# Patient Record
Sex: Female | Born: 1975 | Race: White | Hispanic: No | Marital: Married | Smoking: Never smoker
Health system: Southern US, Community
[De-identification: ages and names within clinical notes are randomized; demographics above are authoritative.]

## PROBLEM LIST (undated history)

## (undated) DIAGNOSIS — K900 Celiac disease: Secondary | ICD-10-CM

## (undated) DIAGNOSIS — T783XXA Angioneurotic edema, initial encounter: Secondary | ICD-10-CM

## (undated) HISTORY — DX: Celiac disease: K90.0

## (undated) HISTORY — PX: KNEE SURGERY: SHX244

## (undated) HISTORY — DX: Angioneurotic edema, initial encounter: T78.3XXA

---

## 1999-03-27 ENCOUNTER — Ambulatory Visit (HOSPITAL_BASED_OUTPATIENT_CLINIC_OR_DEPARTMENT_OTHER): Admission: RE | Admit: 1999-03-27 | Discharge: 1999-03-27 | Payer: Self-pay | Admitting: Orthopedic Surgery

## 1999-06-20 ENCOUNTER — Other Ambulatory Visit: Admission: RE | Admit: 1999-06-20 | Discharge: 1999-06-20 | Payer: Self-pay | Admitting: Obstetrics and Gynecology

## 1999-06-20 ENCOUNTER — Encounter (INDEPENDENT_AMBULATORY_CARE_PROVIDER_SITE_OTHER): Payer: Self-pay | Admitting: Specialist

## 1999-10-08 ENCOUNTER — Other Ambulatory Visit: Admission: RE | Admit: 1999-10-08 | Discharge: 1999-10-08 | Payer: Self-pay | Admitting: Obstetrics and Gynecology

## 2000-03-10 ENCOUNTER — Other Ambulatory Visit: Admission: RE | Admit: 2000-03-10 | Discharge: 2000-03-10 | Payer: Self-pay | Admitting: Obstetrics and Gynecology

## 2000-06-17 ENCOUNTER — Other Ambulatory Visit: Admission: RE | Admit: 2000-06-17 | Discharge: 2000-06-17 | Payer: Self-pay | Admitting: Obstetrics and Gynecology

## 2000-12-30 ENCOUNTER — Other Ambulatory Visit: Admission: RE | Admit: 2000-12-30 | Discharge: 2000-12-30 | Payer: Self-pay | Admitting: Obstetrics and Gynecology

## 2001-08-09 ENCOUNTER — Other Ambulatory Visit: Admission: RE | Admit: 2001-08-09 | Discharge: 2001-08-09 | Payer: Self-pay | Admitting: Obstetrics and Gynecology

## 2003-01-31 ENCOUNTER — Other Ambulatory Visit: Admission: RE | Admit: 2003-01-31 | Discharge: 2003-01-31 | Payer: Self-pay | Admitting: Obstetrics and Gynecology

## 2004-01-04 ENCOUNTER — Other Ambulatory Visit: Admission: RE | Admit: 2004-01-04 | Discharge: 2004-01-04 | Payer: Self-pay | Admitting: Obstetrics and Gynecology

## 2004-01-11 ENCOUNTER — Ambulatory Visit (HOSPITAL_COMMUNITY): Admission: RE | Admit: 2004-01-11 | Discharge: 2004-01-11 | Payer: Self-pay | Admitting: Obstetrics and Gynecology

## 2004-09-25 ENCOUNTER — Inpatient Hospital Stay (HOSPITAL_COMMUNITY): Admission: AD | Admit: 2004-09-25 | Discharge: 2004-09-26 | Payer: Self-pay | Admitting: Obstetrics and Gynecology

## 2004-10-08 ENCOUNTER — Encounter: Admission: RE | Admit: 2004-10-08 | Discharge: 2004-10-08 | Payer: Self-pay | Admitting: Obstetrics and Gynecology

## 2005-03-19 ENCOUNTER — Other Ambulatory Visit: Admission: RE | Admit: 2005-03-19 | Discharge: 2005-03-19 | Payer: Self-pay | Admitting: Obstetrics and Gynecology

## 2006-03-24 ENCOUNTER — Other Ambulatory Visit: Admission: RE | Admit: 2006-03-24 | Discharge: 2006-03-24 | Payer: Self-pay | Admitting: Obstetrics and Gynecology

## 2009-04-17 ENCOUNTER — Encounter: Admission: RE | Admit: 2009-04-17 | Discharge: 2009-04-17 | Payer: Self-pay | Admitting: Obstetrics and Gynecology

## 2010-04-16 ENCOUNTER — Ambulatory Visit (HOSPITAL_COMMUNITY): Admission: RE | Admit: 2010-04-16 | Discharge: 2010-04-16 | Payer: Self-pay | Admitting: Obstetrics and Gynecology

## 2010-07-04 ENCOUNTER — Inpatient Hospital Stay (HOSPITAL_COMMUNITY): Admission: AD | Admit: 2010-07-04 | Discharge: 2010-07-06 | Payer: Self-pay | Admitting: Obstetrics and Gynecology

## 2010-07-08 ENCOUNTER — Ambulatory Visit: Admission: RE | Admit: 2010-07-08 | Discharge: 2010-07-08 | Payer: Self-pay | Admitting: Obstetrics and Gynecology

## 2010-07-22 ENCOUNTER — Ambulatory Visit: Admission: RE | Admit: 2010-07-22 | Discharge: 2010-07-22 | Payer: Self-pay | Admitting: Obstetrics and Gynecology

## 2010-11-23 ENCOUNTER — Encounter: Payer: Self-pay | Admitting: Obstetrics and Gynecology

## 2010-11-24 ENCOUNTER — Encounter: Payer: Self-pay | Admitting: Obstetrics and Gynecology

## 2011-01-15 LAB — CBC
HCT: 35.6 % — ABNORMAL LOW (ref 36.0–46.0)
Hemoglobin: 12.4 g/dL (ref 12.0–15.0)
MCH: 34.8 pg — ABNORMAL HIGH (ref 26.0–34.0)
MCHC: 34.8 g/dL (ref 30.0–36.0)
MCV: 100.2 fL — ABNORMAL HIGH (ref 78.0–100.0)

## 2011-01-15 LAB — RPR: RPR Ser Ql: NONREACTIVE

## 2011-03-20 NOTE — Discharge Summary (Signed)
Erika Bell NO.:  [id number]   MEDICAL RECORD NO.:  [id number]          PATIENT TYPE:  INP   LOCATION:  9141                          FACILITY:  [facility]   PHYSICIAN:  Huel Cote, M.D. DATE OF BIRTH:  [birth year]   DATE OF ADMISSION:  09/25/2004  DATE OF DISCHARGE:  09/26/2004                                 DISCHARGE SUMMARY   DISCHARGE MEDICATIONS:  Motrin 600 mg p.o. q.6h.   DISCHARGE FOLLOW-UP:  The patient is to follow up in the office in 6 weeks  for her routine postpartum exam.   HOSPITAL COURSE:  The patient is a 35 year old G2 P0-0-1-0 who is admitted  at 86 and four-sevenths weeks with a complaint of regular contractions and  rupture of membranes at 3:30 a.m.  She was admitted and progressed well,  with her prenatal care complicated by a second trimester ultrasound with  absent nasal bones.  The patient elected amniocentesis with 46,XY  chromosomes noted.  Otherwise, her care was uneventful.  Prenatal labs are  as follows:  A positive, antibody negative, RPR nonreactive, rubella immune,  hepatitis B surface antigen negative, HIV declined, GC negative, chlamydia  negative, triple screen normal, group B strep negative.  Past OB history:  She had one spontaneous miscarriage in 2005.  Past GYN history:  History of  abnormal Pap smears which resolved without treatment.  Past surgical  history:  None.  Past medical history:  None.  Allergies:  SULFA.  Medications:  None.  She was afebrile with stable vital signs.  On admission  her fetal heart rate was reactive.  The patient progressed to complete  dilation and OP position, receiving no pain medication.  She pushed for  approximately 30 minutes with a normal spontaneous vaginal delivery of a  vigorous female infant in the ROP presentation.  Apgars were 9 and 9, weight  was 7 pounds 9 ounces.  She had a small first degree laceration which was  repaired with 2-0 Vicryl for hemostasis. Cervix and rectum  were intact.  She  was then admitted for routine care and on postpartum day #2 felt very well  and desired discharge home early.  Therefore, she was discharged to home to  follow up in the office in 6 weeks.     Kath   KR/MEDQ  D:  10/27/2004  T:  10/27/2004  Job:  [id number]

## 2013-06-07 ENCOUNTER — Other Ambulatory Visit: Payer: Self-pay | Admitting: Obstetrics and Gynecology

## 2013-06-07 DIAGNOSIS — N644 Mastodynia: Secondary | ICD-10-CM

## 2013-06-07 DIAGNOSIS — R922 Inconclusive mammogram: Secondary | ICD-10-CM

## 2013-06-21 ENCOUNTER — Ambulatory Visit
Admission: RE | Admit: 2013-06-21 | Discharge: 2013-06-21 | Disposition: A | Discharge status: Home / Self Care | Payer: BC Managed Care – PPO | Source: Ambulatory Visit | Attending: Obstetrics and Gynecology | Admitting: Obstetrics and Gynecology

## 2013-06-21 DIAGNOSIS — R9230 Dense breasts, unspecified: Secondary | ICD-10-CM

## 2013-06-21 DIAGNOSIS — N644 Mastodynia: Secondary | ICD-10-CM

## 2013-06-21 DIAGNOSIS — R922 Inconclusive mammogram: Secondary | ICD-10-CM

## 2014-01-12 ENCOUNTER — Encounter: Payer: Self-pay | Admitting: Dietician

## 2014-01-12 ENCOUNTER — Encounter: Payer: BC Managed Care – PPO | Attending: Gastroenterology | Admitting: Dietician

## 2014-01-12 VITALS — Ht 69.0 in | Wt 144.8 lb

## 2014-01-12 DIAGNOSIS — K900 Celiac disease: Secondary | ICD-10-CM

## 2014-01-12 DIAGNOSIS — Z713 Dietary counseling and surveillance: Secondary | ICD-10-CM | POA: Insufficient documentation

## 2014-01-12 NOTE — Patient Instructions (Signed)
Consider adding gluten free multivitamin, B12, and Vitamin D. Try some protein powder to add to smoothies, oatmeal, or drinks. Aim to get a source of protein at each (soy, nuts or nut butter, or beans). Consider getting a new toaster to help avoid cross contamination at home.  Look at this website [employer]ollie [employer]) for guidance: https://glutenfreediet.ca/about_gf.php

## 2014-01-12 NOTE — Progress Notes (Addendum)
  Medical Nutrition Therapy:  Appt start time: 0945 end time:  1045.  Assessment:  Primary concerns today: Nessa is here today since she has celiac disease and was diagnosed in January. States that she had acute symptoms for a year including having problems with dairy, though has had GI issues since early adulthood. Has been following a gluten free diet since January feels fairly confident with that. Also states that she is a vegetarian.    Preferred Learning Style:   No preference indicated   Learning Readiness:   Ready  MEDICATIONS: none   DIETARY INTAKE:  Avoided foods include gluten and meat and garlic    16-XW recall:  B ( AM): gluten free toast with peanut and a latte or protein bar or cereal Snk ( AM): nuts with dried or fresh fruit or cheese   L ( PM): salad or beans and vegetables (cafeteria at [facility]) Snk ( PM): nuts with dried or fresh fruit or cheese or 1/2 protein bar D ( PM): gluten free pasta or pizza with vegetables or vegetarian chili  Snk ( PM): none Beverages: water and unsweet tea with splenda   Usual physical activity: run 3 x week and cross train 3 x week   Estimated energy needs: 1800 calories 200 g carbohydrates 135 g protein 50 g fat  Progress Towards Goal(s):  In progress.   Nutritional Diagnosis:  [place]-1.4 Altered GI function As related to hx of consuming gluten and GI distress.  As evidenced by diagnosis of celiac disease.    Intervention:  Nutrition counseling provided. Plan: Consider adding gluten free multivitamin, B12, and Vitamin D. Try some protein powder to add to smoothies, oatmeal, or drinks. Aim to get a source of protein at each (soy, nuts or nut butter, or beans). Consider getting a new toaster to help avoid cross contamination at home.  Look at this website [employer]oll[employer]) for guidance: https://glutenfreediet.ca/about_gf.php   Teaching Method Utilized:  Visual Auditory Hands on  Handouts given during visit  include:  Protein Supplement List  Vegetarian Proteins  Gluten-free Vegetarian Article  Supplements given during visit include:  2 Unflavored Unjury Protein, lot # [id number], exp 4/16  2 Vanilla Unjury Protein, lot # [id number], exp 1/16  Barriers to learning/adherence to lifestyle change: none  Demonstrated degree of understanding via:  Teach Back   Monitoring/Evaluation:  Dietary intake, exercise, and body weight prn.

## 2016-01-23 ENCOUNTER — Encounter: Payer: Self-pay | Admitting: Family Medicine

## 2016-01-23 ENCOUNTER — Other Ambulatory Visit (INDEPENDENT_AMBULATORY_CARE_PROVIDER_SITE_OTHER): Payer: BLUE CROSS/BLUE SHIELD

## 2016-01-23 ENCOUNTER — Ambulatory Visit (INDEPENDENT_AMBULATORY_CARE_PROVIDER_SITE_OTHER): Payer: BLUE CROSS/BLUE SHIELD | Admitting: Family Medicine

## 2016-01-23 VITALS — BP 116/68 | HR 61 | Ht 69.0 in | Wt 151.0 lb

## 2016-01-23 DIAGNOSIS — M25562 Pain in left knee: Secondary | ICD-10-CM | POA: Diagnosis not present

## 2016-01-23 DIAGNOSIS — S83207A Unspecified tear of unspecified meniscus, current injury, left knee, initial encounter: Secondary | ICD-10-CM | POA: Diagnosis not present

## 2016-01-23 DIAGNOSIS — S86111A Strain of other muscle(s) and tendon(s) of posterior muscle group at lower leg level, right leg, initial encounter: Secondary | ICD-10-CM | POA: Diagnosis not present

## 2016-01-23 MED ORDER — VITAMIN D (ERGOCALCIFEROL) 1.25 MG (50000 UNIT) PO CAPS: 50000.0000 [IU] | ORAL_CAPSULE | ORAL | Status: DC

## 2016-01-23 MED ORDER — DICLOFENAC SODIUM 2 % TD SOLN: 2.0000 "application " | Freq: Two times a day (BID) | TRANSDERMAL | Status: DC

## 2016-01-23 NOTE — Patient Instructions (Signed)
Good to see you.  Ice 20 minutes 2 times daily. Usually after activity and before bed. Exercises 3 times a week.  Avoid deep squats, running jumping or twisting for 4 weeks.  pennsaid pinkie amount topically 2 times daily as needed.  Turmeric 500mg  twice daily  Bromelain 2500mg  with each meal can help with protein absorption.  Once weekly vitamin D for next 12 weeks (at your pharmacy) For the foot wear a heel lift at all times and still avoid being barefoot in the house. Likely because your are compensating for the knee.  Biking elliptical and swimming would be great and continue the weight training but just be careful See me again in 4 weeks.

## 2016-01-23 NOTE — Progress Notes (Signed)
Pre visit review using our clinic review tool, if applicable. No additional management support is needed unless otherwise documented below in the visit note. 

## 2016-01-23 NOTE — Assessment & Plan Note (Signed)
Anterior medial meniscal tear noted. Patient has had removal of the posterior medial and lateral meniscus. Patient has celiac disease and likely is contribute in. We discussed icing regimen, vitamin D supplementation, topical anti-inflammatories, we discussed over-the-counter medications. Home exercises given and patient work with Product/process development scientist. We discussed which activities to avoid. Patient will come back and see me again in 4 weeks. At that time if not doing significant better we'll consider formal physical therapy and injection. If she continues to have pain x-rays will be necessary as well.

## 2016-01-23 NOTE — Assessment & Plan Note (Signed)
We'll further evaluate at follow-up

## 2016-01-23 NOTE — Progress Notes (Signed)
[facility] [place], [place] [address] Phone: [phone] Subjective:       CC: left knee pain  [id number] Erika Bell is a 40 y.o. female coming in with complaint of left knee pain. Patient states that she's had this pain for approximately 1 year. Patient had been training for triathlons. Started having increasing pain. History of meniscectomy in this knee 20 years ago. Patient states that unfortunately she continues to have a dull aching pain that is even affecting her at work. Patient states that sometimes it can be a throbbing pain. Sometimes feels full. 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 Continues to lift on a regular basis. Denies any radiation of pain. Sometimes feels like she is having a locking type mechanism. Patient rates the severity of pain a 6 out of 10. Wondering to avoid any other type of surgical intervention if possible.     Past Medical History  Diagnosis Date  . Celiac disease    Past Surgical History  Procedure Laterality Date  . Knee surgery     Social History   Social History  . Marital Status: Married    Spouse Name: N/A  . Number of Children: N/A  . Years of Education: N/A   Social History Main Topics  . Smoking status: Never Smoker   . Smokeless tobacco: None  . Alcohol Use: None  . Drug Use: None  . Sexual Activity: Not Asked   Other Topics Concern  . None   Social History Narrative   Allergies  Allergen Reactions  . Garlic   . Gluten Meal   . Sulfa Antibiotics    Family History  Problem Relation Age of Onset  . Hypertension Other   . Hyperlipidemia Other   . Obesity Other     Past medical history, social, surgical and family history all reviewed in electronic medical record.  No pertanent information unless stated regarding to the chief complaint.   Review of Systems: No headache, visual changes, nausea, vomiting, diarrhea, constipation, dizziness,  abdominal pain, skin rash, fevers, chills, night sweats, weight loss, swollen lymph nodes, body aches, joint swelling, muscle aches, chest pain, shortness of breath, mood changes.   Objective Blood pressure 116/68, pulse 61, height 5\' 9"  (1.753 m), weight 151 lb (68.493 kg), SpO2 98 %.  General: No apparent distress alert and oriented x3 mood and affect normal, dressed appropriately.  HEENT: Pupils equal, extraocular movements intact  Respiratory: Patient's speak in full sentences and does not appear short of breath  Cardiovascular: No lower extremity edema, non tender, no erythema  Skin: Warm dry intact with no signs of infection or rash on extremities or on axial skeleton.  Abdomen: Soft nontender  Neuro: Cranial nerves II through XII are intact, neurovascularly intact in all extremities with 2+ DTRs and 2+ pulses.  Lymph: No lymphadenopathy of posterior or anterior cervical chain or axillae bilaterally.  Gait normal with good balance and coordination.  MSK:  Non tender with full range of motion and good stability and symmetric strength and tone of shoulders, elbows, wrist, hip, and ankles bilaterally. Patient does have some discomfort of the right posterior tibialis tendon. Knee:left Trace effusion noted Tender to palpation over the medial joint line ROM full in flexion and extension and lower leg rotation. Ligaments with solid consistent endpoints including ACL, PCL, LCL, MCL. positiveMcmurray's, Apley's, and Thessalonian tests. Non painful patellar compression. Patellar glide with mildcrepitus. Patellar and  quadriceps tendons unremarkable. Hamstring and quadriceps strength is normal.  Contralateral knee unremarkable  MSK US performed of: left knee This study was ordered, performed, and interpreted by Charlann Boxer D.O.  Knee: All structures visualized. Patient does have what appears to be a removal of the posterior medial meniscus. Patient does have an acute tear of the anterior  medial meniscus. Increasing Doppler flow and mild displacement noted. Increasing hypoechoic changes in the area as well. Patient's lateral joint line does have mild to moderate arthritic changes. Seems to have removal of the posterior lateral meniscus as well.t. Patellar Tendon unremarkable on long and transverse views without effusion. No abnormality of prepatellar bursa. LCL and MCL unremarkable on long and transverse views. No abnormality of origin of medial or lateral head of the gastrocnemius.  IMPRESSION:  Post surgical changes with acute medial meniscal tear    procedure note 97110; 15 minutes spent for Therapeutic exercises as stated in above notes.  This included exercises focusing on stretching, strengthening, with significant focus on eccentric aspects. Flexion and extension exercises focusing on the hip abductors as well as the vastus medialis oblique muscles for stability. Eccentric exercises avoiding twisting motions.  Proper technique shown and discussed handout in great detail with ATC.  All questions were discussed and answered.   Impression and Recommendations:     This case required medical decision making of moderate complexity.      Note: This dictation was prepared with Dragon dictation along with smaller phrase technology. Any transcriptional errors that result from this process are unintentional.

## 2016-01-24 ENCOUNTER — Encounter: Payer: Self-pay | Admitting: Family Medicine

## 2016-01-24 ENCOUNTER — Telehealth: Payer: Self-pay | Admitting: Family Medicine

## 2016-01-24 NOTE — Telephone Encounter (Signed)
Spoke to Erika Bell, she had some questions for dr Tamala Julian. Since dr Tamala Julian is out of the office today, she is going to set up mychart so she can email him her questions.

## 2016-01-24 NOTE — Telephone Encounter (Signed)
Has questions in regards to vitamins that Dr. Tamala Julian told her to take yesterday. States can not find the dosage he told her to take.

## 2016-02-20 ENCOUNTER — Ambulatory Visit (INDEPENDENT_AMBULATORY_CARE_PROVIDER_SITE_OTHER)
Admission: RE | Admit: 2016-02-20 | Discharge: 2016-02-20 | Disposition: A | Discharge status: Home / Self Care | Payer: BLUE CROSS/BLUE SHIELD | Source: Ambulatory Visit | Attending: Family Medicine | Admitting: Family Medicine

## 2016-02-20 ENCOUNTER — Encounter: Payer: Self-pay | Admitting: Family Medicine

## 2016-02-20 ENCOUNTER — Ambulatory Visit (INDEPENDENT_AMBULATORY_CARE_PROVIDER_SITE_OTHER): Payer: BLUE CROSS/BLUE SHIELD | Admitting: Family Medicine

## 2016-02-20 VITALS — BP 112/72 | HR 60 | Ht 69.0 in | Wt 149.0 lb

## 2016-02-20 DIAGNOSIS — S83207D Unspecified tear of unspecified meniscus, current injury, left knee, subsequent encounter: Secondary | ICD-10-CM | POA: Diagnosis not present

## 2016-02-20 DIAGNOSIS — S83207A Unspecified tear of unspecified meniscus, current injury, left knee, initial encounter: Secondary | ICD-10-CM

## 2016-02-20 DIAGNOSIS — M25562 Pain in left knee: Secondary | ICD-10-CM | POA: Diagnosis not present

## 2016-02-20 NOTE — Progress Notes (Signed)
Pre visit review using our clinic review tool, if applicable. No additional management support is needed unless otherwise documented below in the visit note. 

## 2016-02-20 NOTE — Progress Notes (Signed)
[facility] [place], [place] [address] Phone: [phone] Subjective:       CC: left knee pain follow-up [id number] Erika Bell is a 40 y.o. female coming in with complaint of left knee pain. Patient was found to have an acute meniscal tear of the left knee. Patient states that unfortunately she is having worsening symptoms. States that the conservative therapy did not seem to help. Starting to even walk on her. Has difficulty even straightening the knee held sometimes. Continues to bike ride fairly regularly. Denies any new injury..     Past Medical History  Diagnosis Date  . Celiac disease    Past Surgical History  Procedure Laterality Date  . Knee surgery     Social History   Social History  . Marital Status: Married    Spouse Name: N/A  . Number of Children: N/A  . Years of Education: N/A   Social History Main Topics  . Smoking status: Never Smoker   . Smokeless tobacco: Not on file  . Alcohol Use: Not on file  . Drug Use: Not on file  . Sexual Activity: Not on file   Other Topics Concern  . Not on file   Social History Narrative   Allergies  Allergen Reactions  . Garlic   . Gluten Meal   . Sulfa Antibiotics    Family History  Problem Relation Age of Onset  . Hypertension Other   . Hyperlipidemia Other   . Obesity Other     Past medical history, social, surgical and family history all reviewed in electronic medical record.  No pertanent information unless stated regarding to the chief complaint.   Review of Systems: No headache, visual changes, nausea, vomiting, diarrhea, constipation, dizziness, abdominal pain, skin rash, fevers, chills, night sweats, weight loss, swollen lymph nodes, body aches, joint swelling, muscle aches, chest pain, shortness of breath, mood changes.   Objective There were no vitals taken for this visit.  General: No apparent distress alert and oriented x3 mood and affect  normal, dressed appropriately.  HEENT: Pupils equal, extraocular movements intact  Respiratory: Patient's speak in full sentences and does not appear short of breath  Cardiovascular: No lower extremity edema, non tender, no erythema  Skin: Warm dry intact with no signs of infection or rash on extremities or on axial skeleton.  Abdomen: Soft nontender  Neuro: Cranial nerves II through XII are intact, neurovascularly intact in all extremities with 2+ DTRs and 2+ pulses.  Lymph: No lymphadenopathy of posterior or anterior cervical chain or axillae bilaterally.  Gait normal with good balance and coordination.  MSK:  Non tender with full range of motion and good stability and symmetric strength and tone of shoulders, elbows, wrist, hip, and ankles bilaterally. Patient does have some discomfort of the right posterior tibialis tendon. Knee:left Trace effusion noted about stable from previous exam Tender to palpation over the medial joint line Akin the last 5 of extension Ligaments with solid consistent endpoints including ACL, PCL, LCL, MCL. positiveMcmurray's, Apley's, and Thessalonian tests.worse than previous exam Non painful patellar compression. Patellar glide with mildcrepitus. Patellar and quadriceps tendons unremarkable. Hamstring and quadriceps strength is normal.  Contralateral knee unremarkable  After informed written and verbal consent, patient was seated on exam table. Left knee was prepped with alcohol swab and utilizing anterolateral approach, patient's left knee space was injected with 4:1  marcaine 0.5%: Kenalog 40mg /dL. Patient tolerated the procedure well without immediate complications.  Impression and Recommendations:     This case required medical decision making of moderate complexity.      Note: This dictation was prepared with Dragon dictation along with smaller phrase technology. Any transcriptional errors that result from this process are unintentional.

## 2016-02-20 NOTE — Assessment & Plan Note (Signed)
Patient given injection today and tolerated the procedure well. We discussed icing regimen and home exercises. We discussed which activities to do in which ones to avoid. We discussed formal physical therapy and patient has been referred. Patient has any locking or finds that the range of motion seems to be worsening then she needs to be a candidate for advanced imaging. Patient's goal is to be able to do triathlons this year  Spent  25 minutes with patient face-to-face and had greater than 50% of counseling including as described above in assessment and plan.

## 2016-02-20 NOTE — Patient Instructions (Signed)
Good to see you  Ice is still your friend Erika Bell [facility] Pt for the knee will be great  Stay active but no flexing the knee and twisting.   I would rather have you bike on flat surfaces and elliptical or swimming for at least 2 weeks and then we will see what [name] says otherwise. Xrays downstairs as well.  Continue the vitamins See me again in 4 weeks.

## 2016-03-17 ENCOUNTER — Ambulatory Visit (INDEPENDENT_AMBULATORY_CARE_PROVIDER_SITE_OTHER): Payer: BLUE CROSS/BLUE SHIELD | Admitting: Allergy and Immunology

## 2016-03-17 ENCOUNTER — Encounter: Payer: Self-pay | Admitting: Allergy and Immunology

## 2016-03-17 VITALS — BP 104/62 | HR 64 | Temp 98.3°F | Resp 16 | Ht 67.32 in | Wt 150.0 lb

## 2016-03-17 DIAGNOSIS — J309 Allergic rhinitis, unspecified: Secondary | ICD-10-CM | POA: Diagnosis not present

## 2016-03-17 DIAGNOSIS — R197 Diarrhea, unspecified: Secondary | ICD-10-CM | POA: Diagnosis not present

## 2016-03-17 DIAGNOSIS — K900 Celiac disease: Secondary | ICD-10-CM

## 2016-03-17 DIAGNOSIS — E559 Vitamin D deficiency, unspecified: Secondary | ICD-10-CM

## 2016-03-17 DIAGNOSIS — H1010 Acute atopic conjunctivitis, unspecified eye: Secondary | ICD-10-CM

## 2016-03-17 NOTE — Patient Instructions (Addendum)
  1. Follow-up with Dr. Paulita Fujita concerning further evaluation and treatment of diarrhea  2. Obtain a bone densitometry scan with primary care physician  3. Blood - IgA/G/M  4. Continue Nasonex, montelukast, and antihistamine

## 2016-03-17 NOTE — Progress Notes (Signed)
Dear Dr. Paulita Fujita,  Thank you for referring Erika Bell to the [facility] on 03/17/2016.   Below is a summation of this patient's evaluation and recommendations.  Thank you for your referral. I will keep you informed about this patient's response to treatment.   If you have any questions please to do hestitate to contact me.   Sincerely,  Erika Prows, MD [place]   ______________________________________________________________________    NEW PATIENT NOTE  Referring Provider: Arta Silence, MD Primary Provider: Landry Dyke, MD Date of office visit: 03/17/2016    Subjective:   Chief Complaint:  Erika Bell (DOB: [birth year]) is a 40 y.o. female with a chief complaint of Diarrhea and Allergies  who presents to the clinic on 03/17/2016 with the following problems:  HPI: Erika Bell presents to this clinic in evaluation of diarrhea. She has a history of gluten hypersensitivity diagnosed by Dr. Paulita Fujita with a positive tissue transglutaminase antibody assay and upper endoscopy with duodenal biopsy showing villous architectural changes approximately 2-1/2 years ago. She's been gluten free ever since that point in time. Initially all of her gastrointestinal symptoms resolved and her dorsal hand dermatitis also apparently resolved.   Unfortunately, she has been developing mucousy diarrhea several times per day since around September or so of this past year. She has worked through this issue by eliminating dairy which does appear to help somewhat but she still continues to have significant problems with mucousy diarrhea several times per day without consumption of dairy. On 2 occasions she did have an accident eating dairy since September with consumption of a pizza and consumption of feta cheese and within one hour she developed very severe diarrhea and actually ended up developing  left eye periorbital swelling over the subsequent 12-16 hours with both of those exposures. She had no other associated systemic or constitutional symptoms.  At this point she is gluten-free, dairy free, and basically she is a vegetarian. She has performed informal food elimination diets with various foods that have not affected her pattern of mucousy diarrhea other than dairy elimination which has helped somewhat. She has eliminated various medications for a period in time which did not affect her pattern of mucousy diarrhea.  Erika Bell apparently has a vitamin D deficiency and is now on supplementation including weekly 50,000 international unit boluses of vitamin D. She has not had a bone densitometry scan.  Erika Bell has a history of allergic rhinoconjunctivitis treated with immunotherapy over the course of the past 3 years which she discontinued this past year. She had a good response to this therapy yet still continues to require the use of a nasal steroid and a leukotriene modifier and antihistamine with good results.  Past Medical History  Diagnosis Date  . Celiac disease     Past Surgical History  Procedure Laterality Date  . Knee surgery        Medication List           BROMELAIN PO  Take 500 mg by mouth daily.     loratadine 10 MG tablet  Commonly known as:  CLARITIN  Take 10 mg by mouth daily.     mometasone 50 MCG/ACT nasal spray  Commonly known as:  NASONEX  Place 1 spray into the nose daily.     montelukast 10 MG tablet  Commonly known as:  SINGULAIR  Take 10 mg by mouth at bedtime.  PRENATAL VITAMINS PO  Take 1 tablet by mouth daily.     PROBIOTIC DAILY PO  Take 1 tablet by mouth daily.     Vitamin D (Ergocalciferol) 50000 units Caps capsule  Commonly known as:  DRISDOL  Take 1 capsule (50,000 Units total) by mouth every 7 (seven) days.        Allergies  Allergen Reactions  . Gluten Meal   . Sulfa Antibiotics     Review of systems negative  except as noted in HPI / PMHx or noted below:  Review of Systems  Constitutional: Negative.   HENT: Negative.   Eyes: Negative.   Respiratory: Negative.   Cardiovascular: Negative.   Gastrointestinal: Negative.   Genitourinary: Negative.   Musculoskeletal: Negative.   Skin: Negative.   Neurological: Negative.   Endo/Heme/Allergies: Negative.   Psychiatric/Behavioral: Negative.     Family History  Problem Relation Age of Onset  . Hypertension Other   . Hyperlipidemia Other   . Obesity Other     Social History   Social History  . Marital Status: Married    Spouse Name: N/A  . Number of Children: N/A  . Years of Education: N/A   Occupational History  . Not on file.   Social History Main Topics  . Smoking status: Never Smoker   . Smokeless tobacco: Not on file  . Alcohol Use: Not on file  . Drug Use: Not on file  . Sexual Activity: Not on file   Other Topics Concern  . Not on file   Social History Narrative    Environmental and Social history  Lives in a house with a dry environment, no animals located inside the household, carpeting in the bedroom, no plastic on the bed but plastic on the pillow, and no smokers located inside the household. She is a Economist.   Objective:   Filed Vitals:   03/17/16 0830  BP: 104/62  Pulse: 64  Temp: 98.3 F (36.8 C)  Resp: 16   Height: 5' 7.32" (171 cm) Weight: 150 lb (68.04 kg)  Physical Exam  Constitutional: She is well-developed, well-nourished, and in no distress.  HENT:  Head: Normocephalic. Head is without right periorbital erythema and without left periorbital erythema.  Right Ear: Tympanic membrane, external ear and ear canal normal.  Left Ear: Tympanic membrane, external ear and ear canal normal.  Nose: Nose normal. No mucosal edema or rhinorrhea.  Mouth/Throat: Uvula is midline, oropharynx is clear and moist and mucous membranes are normal. No oropharyngeal exudate.  Eyes: Conjunctivae  and lids are normal. Pupils are equal, round, and reactive to light.  Neck: Trachea normal. No tracheal tenderness present. No tracheal deviation present. No thyromegaly present.  Cardiovascular: Normal rate, regular rhythm, S1 normal, S2 normal and normal heart sounds.   No murmur heard. Pulmonary/Chest: Effort normal and breath sounds normal. No stridor. No tachypnea. No respiratory distress. She has no wheezes. She has no rales. She exhibits no tenderness.  Abdominal: Soft. She exhibits no distension and no mass. There is no hepatosplenomegaly. There is no tenderness. There is no rebound and no guarding.  Musculoskeletal: She exhibits no edema or tenderness.  Lymphadenopathy:       Head (right side): No tonsillar adenopathy present.       Head (left side): No tonsillar adenopathy present.    She has no cervical adenopathy.    She has no axillary adenopathy.  Neurological: She is alert. Gait normal.  Skin: No rash noted. She is  not diaphoretic. No erythema. No pallor. Nails show no clubbing.  Psychiatric: Mood and affect normal.     Diagnostics: Allergy skin tests were performed. She did not demonstrate any significant hypersensitivity against a screening panel of foods  Assessment and Plan:    1. Diarrhea, unspecified type   2. Celiac disease   3. Vitamin D deficiency   4. Allergic rhinoconjunctivitis     1. Follow-up with Dr. Paulita Fujita concerning further evaluation and treatment of diarrhea  2. Obtain a bone densitometry scan with primary care physician  3. Blood - IgA/G/M  4. Continue Nasonex, montelukast, and antihistamine  I cannot really find a tremendous amount of evidence for significant food allergy contributing to Shantara's mucousy diarrhea. I've asked her to follow-up with Dr. Paulita Fujita concerning further evaluation and treatment of this apparent inflammatory process within her gut. I did ask her to check a serum immunoglobulin level to see if she is developing  significant protein loss through her gut. As well, given her very prolonged history of gluten hypersensitivity that probably dated back to childhood and her vitamin D deficiency I think it would be best to obtain a bone densitometry scan to screen for possible osteoporosis. She can certainly continue to use medical therapy for her upper airway inflammatory condition as noted above. We'll see her back in this clinic as needed.  Erika Prows, MD [facility]

## 2016-03-19 ENCOUNTER — Encounter: Payer: Self-pay | Admitting: Family Medicine

## 2016-03-19 ENCOUNTER — Ambulatory Visit (INDEPENDENT_AMBULATORY_CARE_PROVIDER_SITE_OTHER): Payer: BLUE CROSS/BLUE SHIELD | Admitting: Family Medicine

## 2016-03-19 VITALS — BP 114/60 | HR 69 | Ht 67.32 in | Wt 150.0 lb

## 2016-03-19 DIAGNOSIS — S83207D Unspecified tear of unspecified meniscus, current injury, left knee, subsequent encounter: Secondary | ICD-10-CM

## 2016-03-19 NOTE — Assessment & Plan Note (Signed)
Doing much better at this time. Has made some progress. Patient will discontinue formal physical therapy when she finishes this week. Patient given a running protocol. We discussed which activities do in which ones to avoid. Slightly will start increasing activity and follow-up again in 4-6 weeks.  Spent  25 minutes with patient face-to-face and had greater than 50% of counseling including as described above in assessment and plan.

## 2016-03-19 NOTE — Progress Notes (Signed)
Pre visit review using our clinic review tool, if applicable. No additional management support is needed unless otherwise documented below in the visit note. 

## 2016-03-19 NOTE — Patient Instructions (Signed)
Great to see you  Yoga 1 time a week for 2 weeks and then can go up ot 2 times a week thereafter.  Continue the vitamins For the ankle try the pennsaid  [employer]co orthotics "total support" online would be great  Start a walk-run progression: - Initially start one minute walking than one minute running for 20 mins in the first week,   then 25 mins during the second week, then 30 mins afterwards.  Once you have reached 30 mins: - Run 2 mins, then walk 1 min. -Then run 3 mins, and walk 1 min. -Then run 4 mins, and walk 1 min. -Then run 5 mins, and walk 1 min. -Slowly build up weekly to running 30 mins nonstop.  If painful at any of the steps, back up one step. See me again in 4-6 weeks to make sure you are still on the good path!

## 2016-03-19 NOTE — Progress Notes (Signed)
[name] [place], [place] [address] Phone: [phone] Subjective:       CC: left knee pain follow-up [id number] Erika Bell is a 40 y.o. female coming in with complaint of left knee pain. Patient was found to have an acute meniscal tear of the left knee. Patient at last exam was having worsening symptoms and elected to try an injection. Patient was given an injection. X-rays were independently visualized by me and showed mild osteophytic changes. Patient was to continue conservative therapy. Patient statesshe is doing much better. Approximately 90% better. Has not increase her activity she is scared that this will start giving her more pain again. Patient has tried to start slowly decreasing some of the activities on a regular basis.     Past Medical History  Diagnosis Date  . Celiac disease    Past Surgical History  Procedure Laterality Date  . Knee surgery     Social History   Social History  . Marital Status: Married    Spouse Name: N/A  . Number of Children: N/A  . Years of Education: N/A   Social History Main Topics  . Smoking status: Never Smoker   . Smokeless tobacco: None  . Alcohol Use: None  . Drug Use: None  . Sexual Activity: Not Asked   Other Topics Concern  . None   Social History Narrative   Allergies  Allergen Reactions  . Gluten Meal   . Sulfa Antibiotics    Family History  Problem Relation Age of Onset  . Hypertension Other   . Hyperlipidemia Other   . Obesity Other     Past medical history, social, surgical and family history all reviewed in electronic medical record.  No pertanent information unless stated regarding to the chief complaint.   Review of Systems: No headache, visual changes, nausea, vomiting, diarrhea, constipation, dizziness, abdominal pain, skin rash, fevers, chills, night sweats, weight loss, swollen lymph nodes, body aches, joint swelling, muscle aches, chest pain,  shortness of breath, mood changes.   Objective Blood pressure 114/60, pulse 69, height 5' 7.32" (1.71 m), weight 150 lb (68.04 kg), last menstrual period 02/19/2016, SpO2 96 %.  General: No apparent distress alert and oriented x3 mood and affect normal, dressed appropriately.  HEENT: Pupils equal, extraocular movements intact  Respiratory: Patient's speak in full sentences and does not appear short of breath  Cardiovascular: No lower extremity edema, non tender, no erythema  Skin: Warm dry intact with no signs of infection or rash on extremities or on axial skeleton.  Abdomen: Soft nontender  Neuro: Cranial nerves II through XII are intact, neurovascularly intact in all extremities with 2+ DTRs and 2+ pulses.  Lymph: No lymphadenopathy of posterior or anterior cervical chain or axillae bilaterally.  Gait normal with good balance and coordination.  MSK:  Non tender with full range of motion and good stability and symmetric strength and tone of shoulders, elbows, wrist, hip, and ankles bilaterally. Patient does have some discomfort of the right posterior tibialis tendon. Knee:left No effusion noted nontender Full range of motion Ligaments with solid consistent endpoints including ACL, PCL, LCL, MCL. Negative McMurray's Non painful patellar compression. Patellar glide with mildcrepitus. Patellar and quadriceps tendons unremarkable. Hamstring and quadriceps strength is normal.  Contralateral knee unremarkable        Impression and Recommendations:     This case required medical decision making of moderate complexity.      Note: This dictation was  prepared with Dragon dictation along with smaller phrase technology. Any transcriptional errors that result from this process are unintentional.

## 2016-04-01 ENCOUNTER — Other Ambulatory Visit: Payer: Self-pay | Admitting: Orthopedic Surgery

## 2016-04-01 DIAGNOSIS — M25562 Pain in left knee: Secondary | ICD-10-CM

## 2016-04-12 ENCOUNTER — Other Ambulatory Visit: Payer: Self-pay | Admitting: Family Medicine

## 2016-04-15 ENCOUNTER — Ambulatory Visit
Admission: RE | Admit: 2016-04-15 | Discharge: 2016-04-15 | Disposition: A | Discharge status: Home / Self Care | Payer: BLUE CROSS/BLUE SHIELD | Source: Ambulatory Visit | Attending: Orthopedic Surgery | Admitting: Orthopedic Surgery

## 2016-04-15 DIAGNOSIS — M25562 Pain in left knee: Secondary | ICD-10-CM

## 2016-07-21 DIAGNOSIS — Z9889 Other specified postprocedural states: Secondary | ICD-10-CM | POA: Insufficient documentation

## 2017-04-02 DIAGNOSIS — M1712 Unilateral primary osteoarthritis, left knee: Secondary | ICD-10-CM | POA: Insufficient documentation

## 2017-11-22 IMAGING — MR MR KNEE*L* W/O CM
6 series · 40 of 40 positions shown · non-contrast
Comparison: Plain films of the left knee 02/20/2016. MRI left knee
01/03/2009.

CLINICAL DATA: Chronic left knee pain. The patient is a runner.
History of prior left knee surgery for meniscal tear.

EXAM:
MRI OF THE LEFT KNEE WITHOUT CONTRAST
TECHNIQUE: Multiplanar, multisequence MR imaging of the knee was performed. No
intravenous contrast was administered.

[Series 6: PD · axial · 4.0mm · 0.50mm/px · z∈[-17,+93]mm · 8 of 24 slices shown (1 of 2)]
[im 1/24]
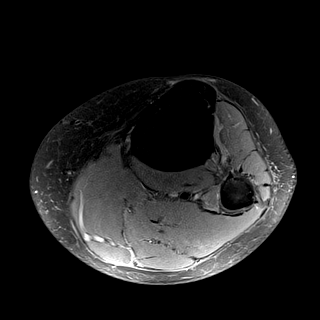
[im 4/24]
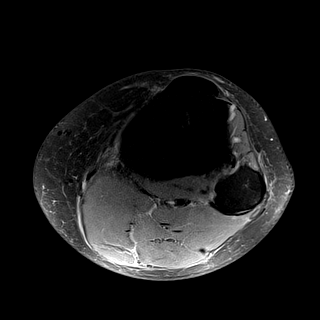
[im 7/24]
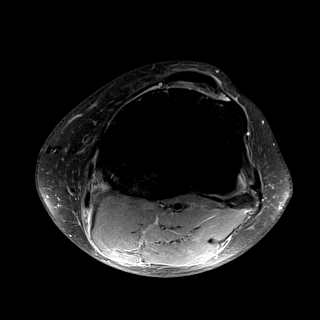
[im 10/24]
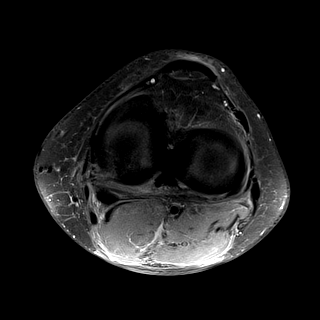
[im 14/24]
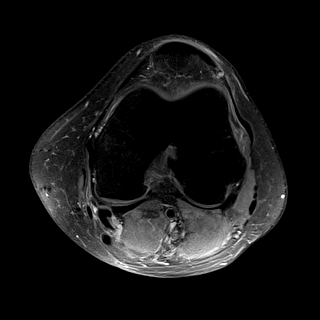
[im 17/24]
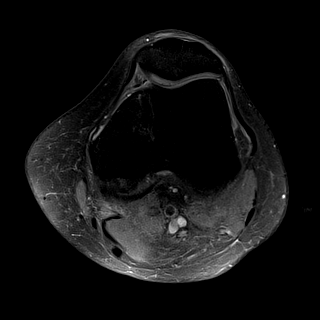
[im 20/24]
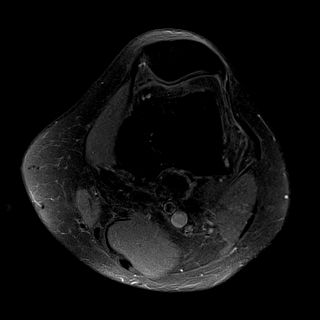
[im 24/24]
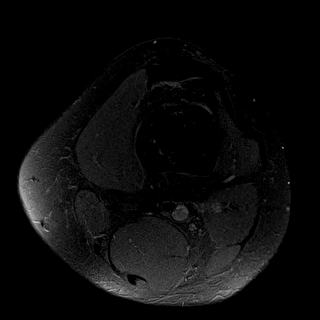

[Series 7: PD fat-sat · sagittal · 4.0mm · 0.50mm/px · 7 of 23 slices shown (1 of 2)]
[im 1/23]
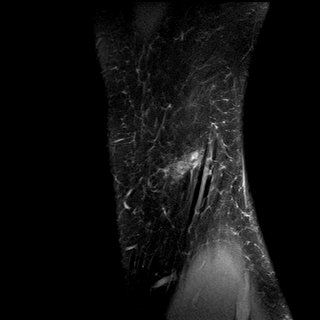
[im 4/23]
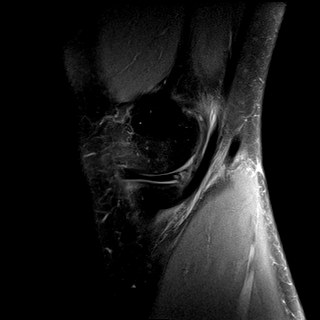
[im 8/23]
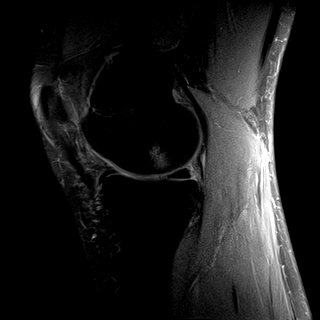
[im 12/23]
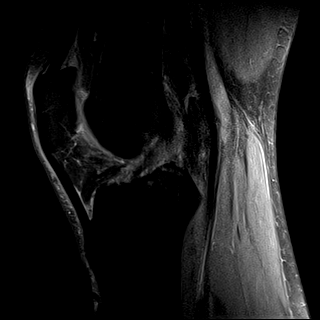
[im 15/23]
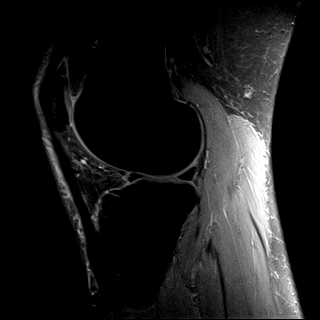
[im 19/23]
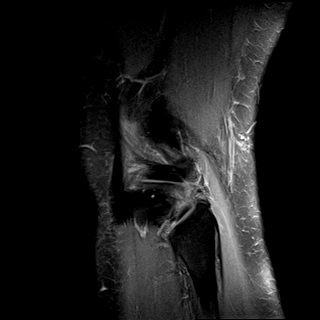
[im 23/23]
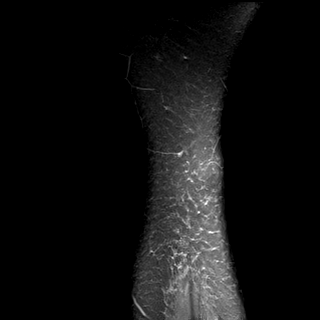

[Series 8: PD · coronal · 4.0mm · 0.53mm/px · 7 of 22 slices shown (2 of 2)]
[im 1/22]
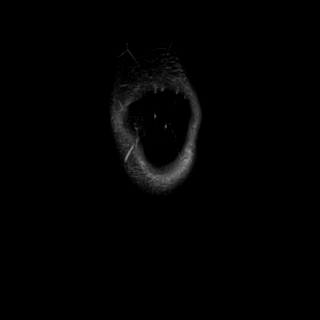
[im 4/22]
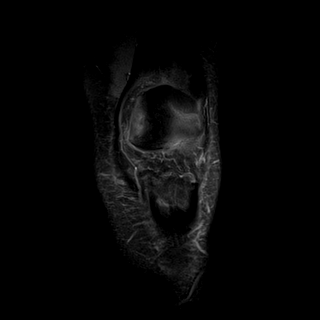
[im 8/22]
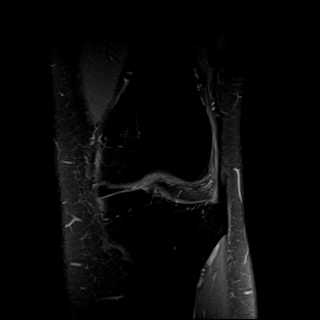
[im 11/22]
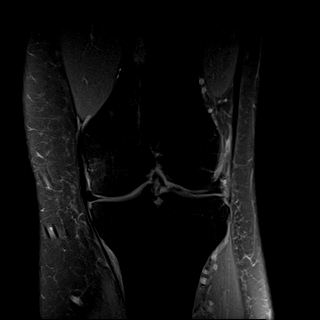
[im 15/22]
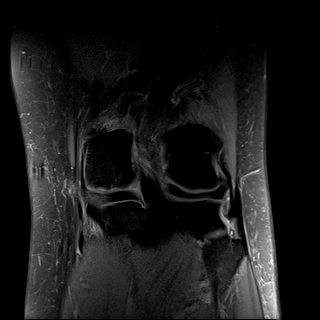
[im 18/22]
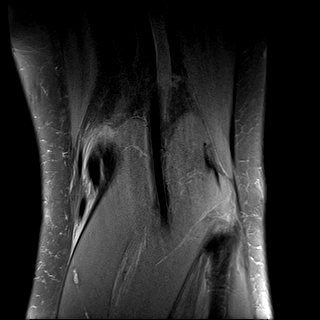
[im 22/22]
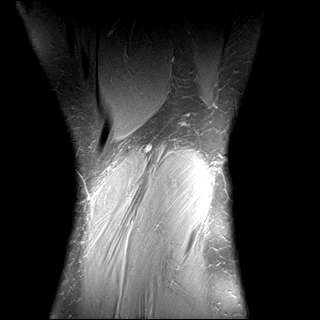

[Series 9: (id) · coronal · 4.0mm · 0.53mm/px · 7 of 22 slices shown]
[im 1/22]
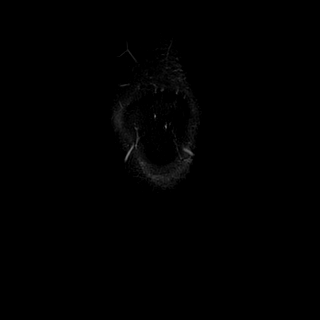
[im 4/22]
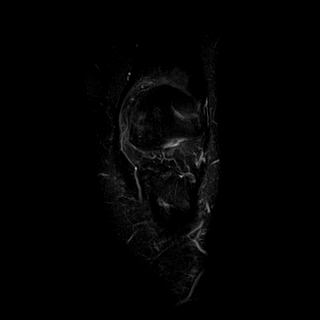
[im 8/22]
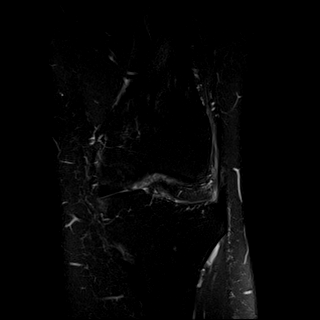
[im 11/22]
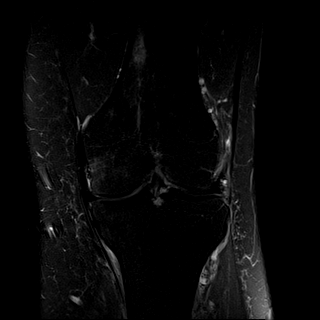
[im 15/22]
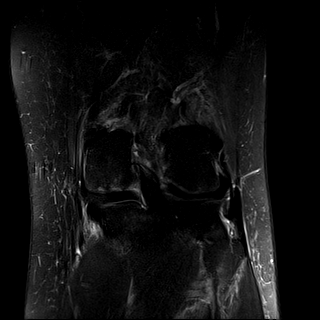
[im 18/22]
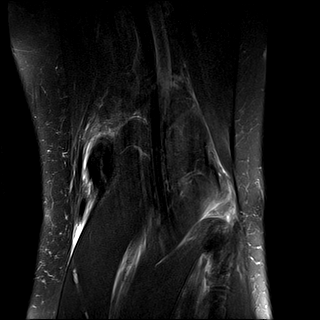
[im 22/22]
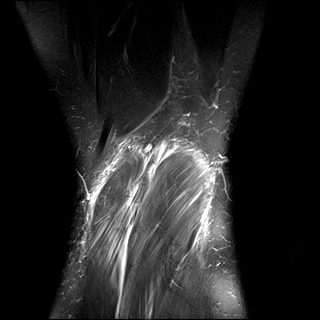

[Series 10: T1 · coronal · 4.0mm · 0.53mm/px · 7 of 22 slices shown]
[im 1/22]
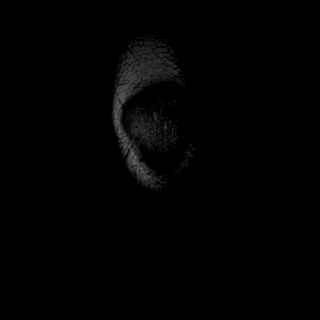
[im 4/22]
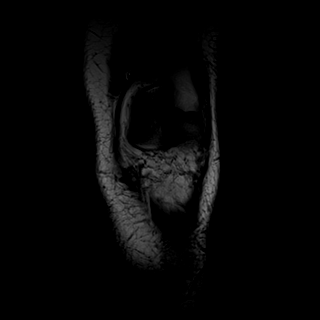
[im 8/22]
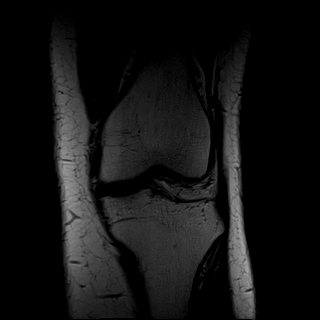
[im 11/22]
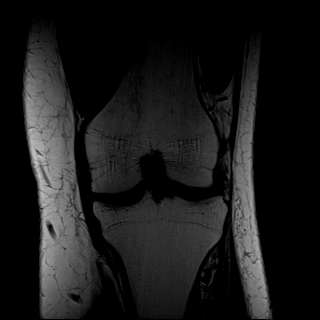
[im 15/22]
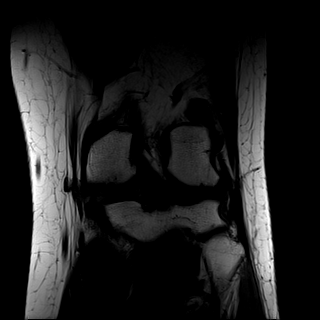
[im 18/22]
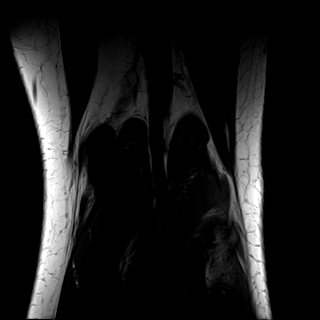
[im 22/22]
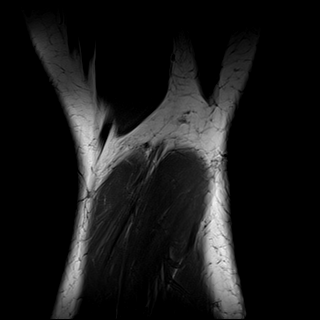

[Series 11: PD fat-sat · coronal · 2.0mm · 0.59mm/px · 4 of 11 slices shown (2 of 2)]
[im 1/11]
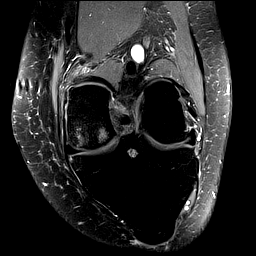
[im 4/11]
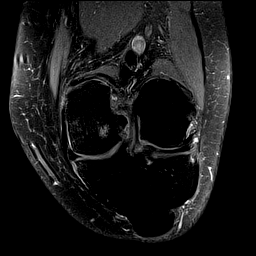
[im 7/11]
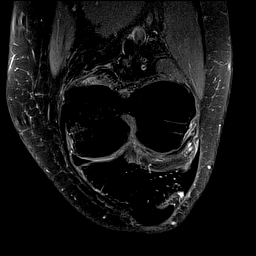
[im 11/11]
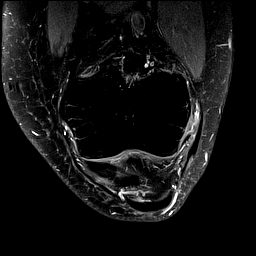

[40 of 40 positions shown; findings below may reference images not displayed]

FINDINGS: MENISCI

Medial meniscus: Small focus of increased T2 signal at the junction
the posterior horn and body of medial meniscus in the periphery of
the meniscus is unchanged and compatible with prior surgery. No
meniscal tear is identified.

Lateral meniscus: There is a new horizontal tear in the anterior
body of the lateral meniscus reaching the femoral articular surface.
Marked fraying along the free edge of the body is seen and the body
is extruded peripherally. The appearance is worsened since the prior
study.

LIGAMENTS

Cruciates:  Intact.

Collaterals:  Intact.

CARTILAGE

Patellofemoral: Mild irregularity of the cartilage surface most
notable along the lateral facet in the superior pole is not notably
changed.

Medial: Cartilage loss most notable along the posterior
weight-bearing surface of the medial femoral condyles shows some
progression since the prior exam.

Lateral:  Unremarkable.

Joint:  No joint effusion.

Popliteal Fossa: Small Baker's cyst has ruptured with fluid
dissecting about the medial gastrocnemius.

Extensor Mechanism:  Intact.

Bones: No fracture or worrisome marrow lesion. Small focus of mild
marrow edema is seen in the distal femoral diaphysis. Subchondral
edema in the medial femoral condyle has increased since the prior
exam. Small osteophytes are seen about the knee.

Other: Mild edema is seen in the visualized medial and lateral
gastrocnemius. No discrete tear is identified.
IMPRESSION: New horizontal tear in the anterior horn body of the lateral
meniscus. Fraying along the free edge of the body of the lateral
meniscus is also now seen.

Progressive medial compartment degenerative change since the prior
examination. Negative for medial meniscal tear.

Small focus of mild marrow edema in the distal femoral diaphysis on
the medial side is worrisome for stress change. No fracture seen.

Mild intrasubstance edema within the medial and lateral
gastrocnemius is likely secondary to strain. No tear is identified.

Small Baker's cyst has ruptured with fluid dissecting along the
superficial surface of the medial gastrocnemius.

## 2019-01-09 MED FILL — OSELTAMIVIR PHOSPHATE 75 MG: 75 | 7 days supply | Qty: 7 | Fill #0

## 2019-01-24 DIAGNOSIS — Z0000 Encounter for general adult medical examination without abnormal findings: Secondary | ICD-10-CM | POA: Diagnosis not present

## 2019-01-24 DIAGNOSIS — E559 Vitamin D deficiency, unspecified: Secondary | ICD-10-CM | POA: Diagnosis not present

## 2019-01-24 DIAGNOSIS — Z13220 Encounter for screening for lipoid disorders: Secondary | ICD-10-CM | POA: Diagnosis not present

## 2019-01-24 DIAGNOSIS — K900 Celiac disease: Secondary | ICD-10-CM | POA: Diagnosis not present

## 2019-09-12 DIAGNOSIS — E559 Vitamin D deficiency, unspecified: Secondary | ICD-10-CM | POA: Diagnosis not present

## 2019-09-12 DIAGNOSIS — Z1389 Encounter for screening for other disorder: Secondary | ICD-10-CM | POA: Diagnosis not present

## 2019-09-12 DIAGNOSIS — Z1231 Encounter for screening mammogram for malignant neoplasm of breast: Secondary | ICD-10-CM | POA: Diagnosis not present

## 2019-09-12 DIAGNOSIS — Z6822 Body mass index (BMI) 22.0-22.9, adult: Secondary | ICD-10-CM | POA: Diagnosis not present

## 2019-09-12 DIAGNOSIS — Z130 Encounter for screening for diseases of the blood and blood-forming organs and certain disorders involving the immune mechanism: Secondary | ICD-10-CM | POA: Diagnosis not present

## 2019-09-12 DIAGNOSIS — Z01419 Encounter for gynecological examination (general) (routine) without abnormal findings: Secondary | ICD-10-CM | POA: Diagnosis not present

## 2019-09-12 MED FILL — VIT D2 1.25 MG (50,000 UNIT: 1.25 MG | 84 days supply | Qty: 12 | Fill #0

## 2019-10-09 ENCOUNTER — Ambulatory Visit (INDEPENDENT_AMBULATORY_CARE_PROVIDER_SITE_OTHER): Payer: 59 | Admitting: Family Medicine

## 2019-10-09 ENCOUNTER — Other Ambulatory Visit: Payer: Self-pay

## 2019-10-09 ENCOUNTER — Ambulatory Visit: Payer: Self-pay

## 2019-10-09 ENCOUNTER — Encounter: Payer: Self-pay | Admitting: Family Medicine

## 2019-10-09 DIAGNOSIS — M25572 Pain in left ankle and joints of left foot: Secondary | ICD-10-CM

## 2019-10-09 DIAGNOSIS — M545 Low back pain, unspecified: Secondary | ICD-10-CM | POA: Insufficient documentation

## 2019-10-09 NOTE — Progress Notes (Signed)
I saw and examined the patient with Dr. Mayer Masker and agree with assessment and plan as outlined.    Left ankle inversion yesterday, still with a significant limp.  Pain anterolateral ankle with swelling.  X-Rays show old-appearing ossicle at tip of fibula.  No definite fracture.  Limited diagnostic ultrasound shows intact dorsal tendons, partial tear of ATFL.  The fibula ossicle does not move with dynamic imaging.  Will treat as grade II ATFL sprain.  Repeat x-rays in 2-3 weeks if not getting better.

## 2019-10-09 NOTE — Progress Notes (Signed)
Erika Bell - 43 y.o. female MRN [id number]  Date of birth: [birth year]  Office Visit Note: Visit Date: 10/09/2019 PCP: Arta Silence, MD Referred by: Eunice Blase, MD  Subjective: Chief Complaint  Patient presents with  . Left Ankle - Pain    Missed a stair on a trail she was running on yesterday, inverting the ankle. Pain medial ankle. Swelling. Could not bear weight yesterday. But then, something popped and she was able to bear weight.   HPI: Erika Bell is a 43 y.o. female who comes in today with acute left ankle pain after a fall yesterday. She reports that she was trail running yesterday, missed a stair, and inverted her ankle and fell. She could not walk on it initially and had to have her husband bring a bike to take her home. She felt something in her anterior ankle pop yesterday and then it felt better and she was able to bear weight. She has pain and swelling over lateral ankle. Has iced ankle.  ROS Otherwise per HPI.  Assessment & Plan: Visit Diagnoses:  1. Pain in left ankle and joints of left foot     Plan: Grade 2 ATF sprain on ultrasound. Ossicle over tip of lateral malleolus noted on x-ray but does not have any surrounding swelling or inflammation on ultrasound. Will treat conservatively with ice, rest, ankle rehabilitation exercises. Return in 2 weeks if pain is persistent, will obtain further imaging at that time.  Meds & Orders: No orders of the defined types were placed in this encounter.   Orders Placed This Encounter  Procedures  . XR Ankle Complete Left  . MSK [place] - NO CHARGES    Follow-up: No follow-ups on file.   Procedures: No procedures performed  No notes on file   Clinical History: No specialty comments available.   She reports that she has never smoked. She does not have any smokeless tobacco history on file. No results for input(s): HGBA1C, LABURIC in the last 8760 hours.  Objective:  VS:  HT:    WT:   BMI:     BP:   HR: bpm   TEMP: ( )  RESP:  Physical Exam  PHYSICAL EXAM: Gen: NAD, alert, cooperative with exam, well-appearing HEENT: clear conjunctiva,  CV:  no edema, capillary refill brisk, normal rate Resp: non-labored Skin: no rashes, normal turgor  Neuro: no gross deficits.  Psych:  alert and oriented  Ortho Exam  Left ankle: Inspection: swelling over lateral malleolus Palpation: TTP over posterior lateral malleolus as well as anterior tip of lateral malleolus. Pain over ATF No pain at base of 5th MT; No tenderness over cuboid; No tenderness over N spot or navicular prominence No tenderness over medial malleolus Talar dome nontender; Range of motion is full in all directions. Dorsiflexion weaker than other planes. Anterior drawer with some laxity, talar tilt with laxity as well No Tinel's or positive signs of loss of sensation over the foot or ankle  Imaging: [place] left ankle:  - Lateral ligaments: ATFL with some hypoechoic changes and neovascularization, appears partially torn - Anterior joint: No evidence of ankle effusion.  Tibiotalar joint appears normal. medial and posterior talar dome unremarkable - Flexor digitorum: Evaluate longitudinal and transverse planes.  Tendon intact with no evidence of tenosynovitis - Flexor  Pollicis longus: Evaluated in longitudinal planes.  Tendon intact with no evidence to tenosynovitis  Impression: Grade 2 ATF sprain  Past Medical/Family/Surgical/Social History: Medications & Allergies reviewed per EMR, new medications  updated. Patient Active Problem List   Diagnosis Date Noted  . Low back pain 10/09/2019  . Arthritis of left knee 04/02/2017  . S/P left knee arthroscopy 07/21/2016  . Acute meniscal tear of left knee 01/23/2016  . Right posterior tibial strain 01/23/2016   Past Medical History:  Diagnosis Date  . Celiac disease    Family History  Problem Relation Age of Onset  . Hypertension Other   . Hyperlipidemia Other   . Obesity Other     Past Surgical History:  Procedure Laterality Date  . KNEE SURGERY     Social History   Occupational History  . Not on file  Tobacco Use  . Smoking status: Never Smoker  Substance and Sexual Activity  . Alcohol use: Not on file  . Drug use: Not on file  . Sexual activity: Not on file

## 2019-12-06 DIAGNOSIS — D225 Melanocytic nevi of trunk: Secondary | ICD-10-CM | POA: Diagnosis not present

## 2019-12-06 DIAGNOSIS — Z86018 Personal history of other benign neoplasm: Secondary | ICD-10-CM | POA: Diagnosis not present

## 2019-12-06 DIAGNOSIS — D2371 Other benign neoplasm of skin of right lower limb, including hip: Secondary | ICD-10-CM | POA: Diagnosis not present

## 2019-12-06 DIAGNOSIS — L719 Rosacea, unspecified: Secondary | ICD-10-CM | POA: Diagnosis not present

## 2019-12-06 DIAGNOSIS — Z808 Family history of malignant neoplasm of other organs or systems: Secondary | ICD-10-CM | POA: Diagnosis not present

## 2019-12-06 DIAGNOSIS — D224 Melanocytic nevi of scalp and neck: Secondary | ICD-10-CM | POA: Diagnosis not present

## 2019-12-06 DIAGNOSIS — D2272 Melanocytic nevi of left lower limb, including hip: Secondary | ICD-10-CM | POA: Diagnosis not present

## 2019-12-06 DIAGNOSIS — Z23 Encounter for immunization: Secondary | ICD-10-CM | POA: Diagnosis not present

## 2019-12-06 DIAGNOSIS — L219 Seborrheic dermatitis, unspecified: Secondary | ICD-10-CM | POA: Diagnosis not present

## 2020-04-15 DIAGNOSIS — Z13220 Encounter for screening for lipoid disorders: Secondary | ICD-10-CM | POA: Diagnosis not present

## 2020-04-15 DIAGNOSIS — Z23 Encounter for immunization: Secondary | ICD-10-CM | POA: Diagnosis not present

## 2020-04-15 DIAGNOSIS — Z0000 Encounter for general adult medical examination without abnormal findings: Secondary | ICD-10-CM | POA: Diagnosis not present

## 2020-04-15 DIAGNOSIS — K900 Celiac disease: Secondary | ICD-10-CM | POA: Diagnosis not present

## 2020-04-15 DIAGNOSIS — E559 Vitamin D deficiency, unspecified: Secondary | ICD-10-CM | POA: Diagnosis not present

## 2020-04-15 DIAGNOSIS — Z5181 Encounter for therapeutic drug level monitoring: Secondary | ICD-10-CM | POA: Diagnosis not present

## 2020-10-18 DIAGNOSIS — Z1231 Encounter for screening mammogram for malignant neoplasm of breast: Secondary | ICD-10-CM | POA: Diagnosis not present

## 2020-10-18 DIAGNOSIS — Z01419 Encounter for gynecological examination (general) (routine) without abnormal findings: Secondary | ICD-10-CM | POA: Diagnosis not present

## 2020-10-18 DIAGNOSIS — Z1389 Encounter for screening for other disorder: Secondary | ICD-10-CM | POA: Diagnosis not present

## 2020-10-18 DIAGNOSIS — Z6822 Body mass index (BMI) 22.0-22.9, adult: Secondary | ICD-10-CM | POA: Diagnosis not present

## 2020-10-18 DIAGNOSIS — Z130 Encounter for screening for diseases of the blood and blood-forming organs and certain disorders involving the immune mechanism: Secondary | ICD-10-CM | POA: Diagnosis not present

## 2021-02-27 DIAGNOSIS — L700 Acne vulgaris: Secondary | ICD-10-CM | POA: Diagnosis not present

## 2021-02-27 DIAGNOSIS — Z79899 Other long term (current) drug therapy: Secondary | ICD-10-CM | POA: Diagnosis not present

## 2021-04-02 ENCOUNTER — Other Ambulatory Visit (HOSPITAL_BASED_OUTPATIENT_CLINIC_OR_DEPARTMENT_OTHER): Payer: Self-pay

## 2021-04-02 DIAGNOSIS — L700 Acne vulgaris: Secondary | ICD-10-CM | POA: Diagnosis not present

## 2021-04-02 DIAGNOSIS — Z79899 Other long term (current) drug therapy: Secondary | ICD-10-CM | POA: Diagnosis not present

## 2021-04-02 MED ORDER — ISOTRETINOIN 40 MG PO CAPS
40.0000 mg | ORAL_CAPSULE | Freq: Every day | ORAL | 0 refills | Status: DC
  Filled 2021-04-02: qty 30, 30d supply, fill #0

## 2021-04-03 ENCOUNTER — Other Ambulatory Visit (HOSPITAL_BASED_OUTPATIENT_CLINIC_OR_DEPARTMENT_OTHER): Payer: Self-pay

## 2021-05-02 ENCOUNTER — Other Ambulatory Visit (HOSPITAL_BASED_OUTPATIENT_CLINIC_OR_DEPARTMENT_OTHER): Payer: Self-pay

## 2021-05-02 ENCOUNTER — Other Ambulatory Visit (HOSPITAL_COMMUNITY): Payer: Self-pay

## 2021-05-02 DIAGNOSIS — Z79899 Other long term (current) drug therapy: Secondary | ICD-10-CM | POA: Diagnosis not present

## 2021-05-02 DIAGNOSIS — L700 Acne vulgaris: Secondary | ICD-10-CM | POA: Diagnosis not present

## 2021-05-02 MED ORDER — ISOTRETINOIN 30 MG PO CAPS
60.0000 mg | ORAL_CAPSULE | Freq: Every day | ORAL | 0 refills | Status: DC
  Filled 2021-05-02 (×2): qty 60, 30d supply, fill #0

## 2021-06-02 DIAGNOSIS — L700 Acne vulgaris: Secondary | ICD-10-CM | POA: Diagnosis not present

## 2021-06-02 DIAGNOSIS — Z79899 Other long term (current) drug therapy: Secondary | ICD-10-CM | POA: Diagnosis not present

## 2021-07-01 DIAGNOSIS — N924 Excessive bleeding in the premenopausal period: Secondary | ICD-10-CM | POA: Diagnosis not present

## 2021-07-01 DIAGNOSIS — Z5181 Encounter for therapeutic drug level monitoring: Secondary | ICD-10-CM | POA: Diagnosis not present

## 2021-07-01 DIAGNOSIS — K900 Celiac disease: Secondary | ICD-10-CM | POA: Diagnosis not present

## 2021-07-01 DIAGNOSIS — Z0000 Encounter for general adult medical examination without abnormal findings: Secondary | ICD-10-CM | POA: Diagnosis not present

## 2021-07-01 DIAGNOSIS — E559 Vitamin D deficiency, unspecified: Secondary | ICD-10-CM | POA: Diagnosis not present

## 2021-07-04 DIAGNOSIS — Z79899 Other long term (current) drug therapy: Secondary | ICD-10-CM | POA: Diagnosis not present

## 2021-07-04 DIAGNOSIS — T887XXA Unspecified adverse effect of drug or medicament, initial encounter: Secondary | ICD-10-CM | POA: Diagnosis not present

## 2021-07-04 DIAGNOSIS — L700 Acne vulgaris: Secondary | ICD-10-CM | POA: Diagnosis not present

## 2021-07-24 DIAGNOSIS — M1712 Unilateral primary osteoarthritis, left knee: Secondary | ICD-10-CM | POA: Diagnosis not present

## 2021-07-24 DIAGNOSIS — Z9889 Other specified postprocedural states: Secondary | ICD-10-CM | POA: Diagnosis not present

## 2021-08-05 DIAGNOSIS — L730 Acne keloid: Secondary | ICD-10-CM | POA: Diagnosis not present

## 2021-08-05 DIAGNOSIS — T887XXA Unspecified adverse effect of drug or medicament, initial encounter: Secondary | ICD-10-CM | POA: Diagnosis not present

## 2021-08-05 DIAGNOSIS — Z23 Encounter for immunization: Secondary | ICD-10-CM | POA: Diagnosis not present

## 2021-08-05 DIAGNOSIS — L700 Acne vulgaris: Secondary | ICD-10-CM | POA: Diagnosis not present

## 2021-08-05 DIAGNOSIS — L988 Other specified disorders of the skin and subcutaneous tissue: Secondary | ICD-10-CM | POA: Diagnosis not present

## 2021-08-05 DIAGNOSIS — Z79899 Other long term (current) drug therapy: Secondary | ICD-10-CM | POA: Diagnosis not present

## 2021-08-08 DIAGNOSIS — G8929 Other chronic pain: Secondary | ICD-10-CM | POA: Diagnosis not present

## 2021-08-08 DIAGNOSIS — M25562 Pain in left knee: Secondary | ICD-10-CM | POA: Diagnosis not present

## 2021-09-05 DIAGNOSIS — Z79899 Other long term (current) drug therapy: Secondary | ICD-10-CM | POA: Diagnosis not present

## 2021-09-05 DIAGNOSIS — L700 Acne vulgaris: Secondary | ICD-10-CM | POA: Diagnosis not present

## 2021-09-05 DIAGNOSIS — T887XXA Unspecified adverse effect of drug or medicament, initial encounter: Secondary | ICD-10-CM | POA: Diagnosis not present

## 2021-09-05 DIAGNOSIS — L988 Other specified disorders of the skin and subcutaneous tissue: Secondary | ICD-10-CM | POA: Diagnosis not present

## 2021-09-05 DIAGNOSIS — Z23 Encounter for immunization: Secondary | ICD-10-CM | POA: Diagnosis not present

## 2021-09-05 DIAGNOSIS — L730 Acne keloid: Secondary | ICD-10-CM | POA: Diagnosis not present

## 2021-10-09 DIAGNOSIS — L730 Acne keloid: Secondary | ICD-10-CM | POA: Diagnosis not present

## 2021-10-09 DIAGNOSIS — T887XXA Unspecified adverse effect of drug or medicament, initial encounter: Secondary | ICD-10-CM | POA: Diagnosis not present

## 2021-10-09 DIAGNOSIS — Z79899 Other long term (current) drug therapy: Secondary | ICD-10-CM | POA: Diagnosis not present

## 2021-10-09 DIAGNOSIS — L700 Acne vulgaris: Secondary | ICD-10-CM | POA: Diagnosis not present

## 2021-10-09 DIAGNOSIS — Z23 Encounter for immunization: Secondary | ICD-10-CM | POA: Diagnosis not present

## 2021-10-14 DIAGNOSIS — M1712 Unilateral primary osteoarthritis, left knee: Secondary | ICD-10-CM | POA: Diagnosis not present

## 2021-10-14 DIAGNOSIS — Z9889 Other specified postprocedural states: Secondary | ICD-10-CM | POA: Diagnosis not present

## 2021-11-10 DIAGNOSIS — L700 Acne vulgaris: Secondary | ICD-10-CM | POA: Diagnosis not present

## 2021-11-10 DIAGNOSIS — Z79899 Other long term (current) drug therapy: Secondary | ICD-10-CM | POA: Diagnosis not present

## 2021-12-08 DIAGNOSIS — Z1389 Encounter for screening for other disorder: Secondary | ICD-10-CM | POA: Diagnosis not present

## 2021-12-08 DIAGNOSIS — Z1151 Encounter for screening for human papillomavirus (HPV): Secondary | ICD-10-CM | POA: Diagnosis not present

## 2021-12-08 DIAGNOSIS — Z1231 Encounter for screening mammogram for malignant neoplasm of breast: Secondary | ICD-10-CM | POA: Diagnosis not present

## 2021-12-08 DIAGNOSIS — Z124 Encounter for screening for malignant neoplasm of cervix: Secondary | ICD-10-CM | POA: Diagnosis not present

## 2021-12-08 DIAGNOSIS — Z01419 Encounter for gynecological examination (general) (routine) without abnormal findings: Secondary | ICD-10-CM | POA: Diagnosis not present

## 2021-12-16 DIAGNOSIS — Z86018 Personal history of other benign neoplasm: Secondary | ICD-10-CM | POA: Diagnosis not present

## 2021-12-16 DIAGNOSIS — L821 Other seborrheic keratosis: Secondary | ICD-10-CM | POA: Diagnosis not present

## 2021-12-16 DIAGNOSIS — D2371 Other benign neoplasm of skin of right lower limb, including hip: Secondary | ICD-10-CM | POA: Diagnosis not present

## 2021-12-16 DIAGNOSIS — L814 Other melanin hyperpigmentation: Secondary | ICD-10-CM | POA: Diagnosis not present

## 2021-12-16 DIAGNOSIS — D1801 Hemangioma of skin and subcutaneous tissue: Secondary | ICD-10-CM | POA: Diagnosis not present

## 2021-12-16 DIAGNOSIS — D229 Melanocytic nevi, unspecified: Secondary | ICD-10-CM | POA: Diagnosis not present

## 2021-12-16 DIAGNOSIS — Z23 Encounter for immunization: Secondary | ICD-10-CM | POA: Diagnosis not present

## 2021-12-16 DIAGNOSIS — L810 Postinflammatory hyperpigmentation: Secondary | ICD-10-CM | POA: Diagnosis not present

## 2021-12-16 DIAGNOSIS — L578 Other skin changes due to chronic exposure to nonionizing radiation: Secondary | ICD-10-CM | POA: Diagnosis not present

## 2022-01-27 DIAGNOSIS — N920 Excessive and frequent menstruation with regular cycle: Secondary | ICD-10-CM | POA: Diagnosis not present

## 2022-01-27 DIAGNOSIS — N939 Abnormal uterine and vaginal bleeding, unspecified: Secondary | ICD-10-CM | POA: Diagnosis not present

## 2022-01-27 DIAGNOSIS — N840 Polyp of corpus uteri: Secondary | ICD-10-CM | POA: Diagnosis not present

## 2022-02-11 ENCOUNTER — Other Ambulatory Visit (HOSPITAL_COMMUNITY): Payer: Self-pay

## 2022-02-11 MED ORDER — OXYCODONE HCL 5 MG PO TABS
5.0000 mg | ORAL_TABLET | ORAL | 0 refills | Status: DC | PRN
  Filled 2022-02-11: qty 10, 2d supply, fill #0

## 2022-02-11 MED ORDER — ONDANSETRON 8 MG PO TBDP
8.0000 mg | ORAL_TABLET | Freq: Two times a day (BID) | ORAL | 0 refills | Status: DC | PRN
  Filled 2022-02-11: qty 10, 5d supply, fill #0

## 2022-02-11 MED ORDER — MISOPROSTOL 200 MCG PO TABS
ORAL_TABLET | ORAL | 0 refills | Status: DC
  Filled 2022-02-11: qty 2, 1d supply, fill #0

## 2022-02-12 ENCOUNTER — Other Ambulatory Visit (HOSPITAL_COMMUNITY): Payer: Self-pay

## 2022-02-17 DIAGNOSIS — N920 Excessive and frequent menstruation with regular cycle: Secondary | ICD-10-CM | POA: Diagnosis not present

## 2022-02-17 DIAGNOSIS — N840 Polyp of corpus uteri: Secondary | ICD-10-CM | POA: Diagnosis not present

## 2022-02-17 DIAGNOSIS — Z3202 Encounter for pregnancy test, result negative: Secondary | ICD-10-CM | POA: Diagnosis not present

## 2022-05-15 DIAGNOSIS — M1712 Unilateral primary osteoarthritis, left knee: Secondary | ICD-10-CM | POA: Diagnosis not present

## 2022-05-15 DIAGNOSIS — Z9889 Other specified postprocedural states: Secondary | ICD-10-CM | POA: Diagnosis not present

## 2022-06-19 DIAGNOSIS — M2352 Chronic instability of knee, left knee: Secondary | ICD-10-CM | POA: Diagnosis not present

## 2022-06-19 DIAGNOSIS — M25562 Pain in left knee: Secondary | ICD-10-CM | POA: Diagnosis not present

## 2022-06-19 DIAGNOSIS — G8929 Other chronic pain: Secondary | ICD-10-CM | POA: Diagnosis not present

## 2022-06-22 ENCOUNTER — Other Ambulatory Visit: Payer: Self-pay | Admitting: Orthopedic Surgery

## 2022-06-22 DIAGNOSIS — M25562 Pain in left knee: Secondary | ICD-10-CM

## 2022-07-19 ENCOUNTER — Ambulatory Visit
Admission: RE | Admit: 2022-07-19 | Discharge: 2022-07-19 | Disposition: A | Discharge status: Home / Self Care | Payer: 59 | Source: Ambulatory Visit | Attending: Orthopedic Surgery | Admitting: Orthopedic Surgery

## 2022-07-19 DIAGNOSIS — M25562 Pain in left knee: Secondary | ICD-10-CM

## 2022-07-26 ENCOUNTER — Ambulatory Visit
Admission: RE | Admit: 2022-07-26 | Discharge: 2022-07-26 | Disposition: A | Discharge status: Home / Self Care | Payer: 59 | Source: Ambulatory Visit | Attending: Orthopedic Surgery | Admitting: Orthopedic Surgery

## 2022-07-26 DIAGNOSIS — S83232A Complex tear of medial meniscus, current injury, left knee, initial encounter: Secondary | ICD-10-CM | POA: Diagnosis not present

## 2022-08-01 ENCOUNTER — Telehealth: Payer: 59 | Admitting: Nurse Practitioner

## 2022-08-01 DIAGNOSIS — L309 Dermatitis, unspecified: Secondary | ICD-10-CM

## 2022-08-01 MED ORDER — MOMETASONE FUROATE 0.1 % EX OINT
TOPICAL_OINTMENT | Freq: Every day | CUTANEOUS | 0 refills | Status: DC
  Filled 2022-08-01: qty 15, 30d supply, fill #0

## 2022-08-01 NOTE — Progress Notes (Signed)
E-Visit for Eczema  We are sorry that you are not feeling well. Here is how we plan to help! Based on what you shared with me it looks like you have eczema (atopic dermatitis).  Although the cause of eczema is not completely understood, genetics appear to play a strong role, and people with a family history of eczema are at increased risk of developing the condition. In most people with eczema, there is a genetic abnormality in the outermost layer of the skin, called the epidermis   Most people with eczema develop their first symptoms as children, before the age of 90. Intense itching of the skin, patches of redness, small bumps, and skin flaking are common. Scratching can further inflame the skin and worsen the itching. The itchiness may be more noticeable at nighttime.  Eczema commonly affects the back of the neck, the elbow creases, and the backs of the knees. Other affected areas may include the face, wrists, and forearms. The skin may become thickened and darkened, or even scarred, from repeated scratching. Eliminating factors that aggravate your eczema symptoms can help to control the symptoms. Possible triggers may include: ? Cold or dry environments ? Sweating ? Emotional stress or anxiety ? Rapid temperature changes ? Exposure to certain chemicals or cleaning solutions, including soaps and detergents, perfumes and cosmetics, wool or synthetic fibers, dust, sand, and cigarette smoke Keeping your skin hydrated Emollients -- Emollients are creams and ointments that moisturize the skin and prevent it from drying out. The best emollients for people with eczema are thick creams (such as Eucerin, Cetaphil, and Nutraderm) or ointments (such as petroleum jelly, Aquaphor, and Vaseline), which contain little to no water. Emollients are most effective when applied immediately after bathing. Emollients can be applied twice a day or more often if needed. Lotions contain more water than creams and  ointments and are less effective for moisturizing the skin. Bathing -- It is not clear if showers or baths are better for keeping the skin hydrated. Lukewarm baths or showers can hydrate and cool the skin, temporarily relieving itching from eczema. An unscented, mild soap or non-soap cleanser (such as Cetaphil) should be used sparingly. Apply an emollient immediately after bathing or showering to prevent your skin from drying out as a result of water evaporation. Emollient bath additives (products you add to the bath water) have not been found to help relieve symptoms. Hot or long baths (more than 10 to 15 minutes) and showers should be avoided since they can dry out the skin.  Based on what you shared with me you may have eczema. I have prescribed mometasone per your request please be aware it is considered a high potency steroid and should not be used long term on the face.    I recommend dilute bleach baths for people with eczema. These baths help to decrease the number of bacteria on the skin that can cause infections or worsen symptoms. To prepare a bleach bath, one-fourth to one-half cup of bleach is placed in a full bathtub (about 40 gallons) of water. Bleach baths are usually taken for 5 to 10 minutes twice per week and should be followed by application of an emollient (listed above). I recommend you take Benadryl '25mg'$  - '50mg'$  every 4 hours to control the symptoms (including itching) but if they last over 24 hours it is best that you see an office based provider for follow up.  HOME CARE: Take lukewarm showers or baths Apply creams and ointments to prevent  the skin from drying (Eucerin, Cetaphil, Nutraderm, petroleum jelly, Aquaphor or Vaseline) - these products contain less water than other lotions and are more effective for moisturizing the skin Limit exposure to cold or dry environments, sweating, emotional stress and anxiety, rapid temperature changes and exposure to chemicals and cleaning  products, soaps and detergents, perfumes, cosmetics, wool and synthetic fibers, dust, sand and cigarette- factors which can aggravate eczema symptoms.  Use a hydrocortisone cream once or twice a day Take an antihistamine like Benadryl for widespread rashes that itch.  The adult dosage of Benadryl is 25-50 mg by mouth 4 times daily. Caution: This type of medication may cause sleepiness.  Do not drink alcohol, drive, or operate dangerous machinery while taking antihistamines.  Do not take these medications if you have prostate enlargement.  Read the package instructions thoroughly on all medications that you take.  GET HELP RIGHT AWAY IF: Symptoms that don't go away after treatment. Severe itching that persists. You develop a fever. Your skin begins to drain. You have a sore throat. You become short of breath.  MAKE SURE YOU   Understand these instructions. Will watch your condition. Will get help right away if you are not doing well or get worse.    Thank you for choosing an e-visit.  Your e-visit answers were reviewed by a board certified advanced clinical practitioner to complete your personal care plan. Depending upon the condition, your plan could have included both over the counter or prescription medications.  Please review your pharmacy choice. Make sure the pharmacy is open so you can pick up prescription now. If there is a problem, you may contact your provider through [employer] and have the prescription routed to another pharmacy.  Your safety is important to [place]. If you have drug allergies check your prescription carefully.   For the next 24 hours you can use MyChart to ask questions about today's visit, request a non-urgent call back, or ask for a work or school excuse. You will get an email in the next two days asking about your experience. I hope that your e-visit has been valuable and will speed your recovery.

## 2022-08-01 NOTE — Progress Notes (Signed)
I have spent 5 minutes in review of e-visit questionnaire, review and updating patient chart, medical decision making and response to patient.  ° °Lizbett Garciagarcia W Dorthie Santini, NP ° °  °

## 2022-08-02 ENCOUNTER — Other Ambulatory Visit (HOSPITAL_BASED_OUTPATIENT_CLINIC_OR_DEPARTMENT_OTHER): Payer: Self-pay

## 2022-08-03 ENCOUNTER — Other Ambulatory Visit (HOSPITAL_BASED_OUTPATIENT_CLINIC_OR_DEPARTMENT_OTHER): Payer: Self-pay

## 2022-08-11 DIAGNOSIS — M1712 Unilateral primary osteoarthritis, left knee: Secondary | ICD-10-CM | POA: Diagnosis not present

## 2022-08-30 NOTE — Progress Notes (Unsigned)
New Patient Note  RE: Erika Bell MRN: [id number] DOB: 06/02/1976 Date of Office Visit: 08/31/2022  Consult requested by: Arta Silence, MD Primary care provider: Arta Silence, MD  Chief Complaint: No chief complaint on file.  History of Present Illness: I had the pleasure of seeing Erika Bell for initial evaluation at the [facility] [place] [facility] [place] on 08/30/2022. She is a 46 y.o. female, who is referred here by Arta Silence, MD for the evaluation of metal allergy.  Patient is scheduled for *** replacement on ***. Patient had issues with ***.  No issues with watches, rings, bracelets, necklaces, belts, buttons on pants.   Prior joint replacements or metals in the body: ***.  Assessment and Plan: Erika Bell is a 46 y.o. female with: No problem-specific Assessment & Plan notes found for this encounter.  No follow-ups on file.  No orders of the defined types were placed in this encounter.  Lab Orders  No laboratory test(s) ordered today    Other allergy screening: Asthma: {Blank single:19197::"yes","no"} Rhino conjunctivitis: {Blank single:19197::"yes","no"} Food allergy: {Blank single:19197::"yes","no"} Medication allergy: {Blank single:19197::"yes","no"} Hymenoptera allergy: {Blank single:19197::"yes","no"} Urticaria: {Blank single:19197::"yes","no"} Eczema:{Blank single:19197::"yes","no"} History of recurrent infections suggestive of immunodeficency: {Blank single:19197::"yes","no"}  Diagnostics: Spirometry:  Tracings reviewed. Her effort: {Blank single:19197::"Good reproducible efforts.","It was hard to get consistent efforts and there is a question as to whether this reflects a maximal maneuver.","Poor effort, data can not be interpreted."} FVC: ***L FEV1: ***L, ***% predicted FEV1/FVC ratio: ***% Interpretation: {Blank single:19197::"Spirometry consistent with mild obstructive disease","Spirometry consistent with moderate obstructive  disease","Spirometry consistent with severe obstructive disease","Spirometry consistent with possible restrictive disease","Spirometry consistent with mixed obstructive and restrictive disease","Spirometry uninterpretable due to technique","Spirometry consistent with normal pattern","No overt abnormalities noted given today's efforts"}.  Please see scanned spirometry results for details.  Skin Testing: {Blank single:19197::"Select foods","Environmental allergy panel","Environmental allergy panel and select foods","Food allergy panel","None","Deferred due to recent antihistamines use"}. *** Results discussed with patient/family.   Past Medical History: Patient Active Problem List   Diagnosis Date Noted  . Low back pain 10/09/2019  . Arthritis of left knee 04/02/2017  . S/P left knee arthroscopy 07/21/2016  . Acute meniscal tear of left knee 01/23/2016  . Right posterior tibial strain 01/23/2016   Past Medical History:  Diagnosis Date  . Celiac disease    Past Surgical History: Past Surgical History:  Procedure Laterality Date  . KNEE SURGERY     Medication List:  Current Outpatient Medications  Medication Sig Dispense Refill  . ibuprofen (ADVIL) 200 MG tablet Take 200 mg by mouth every 6 (six) hours as needed.    . ISOtretinoin (ACCUTANE) 30 MG capsule Take 2 capsules (60 mg total) by mouth daily with a fatty meal as directed 60 capsule 0  . ISOtretinoin (ACCUTANE) 40 MG capsule Take 1 capsule (40 mg total) by mouth daily with a fatty meal. 30 capsule 0  . misoprostol (CYTOTEC) 200 MCG tablet Place 2 moistened tablets in vagina 2 hours prior to procedure 2 tablet 0  . mometasone (ELOCON) 0.1 % ointment Apply topically daily. 45 g 0  . ondansetron (ZOFRAN-ODT) 8 MG disintegrating tablet Take 1 tablet (8 mg total) by mouth 2 (two) times daily as needed. 10 tablet 0  . oxyCODONE (OXY IR/ROXICODONE) 5 MG immediate release tablet Take 1 tablet (5 mg total) by mouth every 4 (four) hours  as needed. 10 tablet 0  . Vitamin D, Ergocalciferol, (DRISDOL) 50000 units CAPS capsule TAKE 1 CAPSULE (50,000 UNITS TOTAL) BY MOUTH EVERY  7 (SEVEN) DAYS. 12 capsule 0   No current facility-administered medications for this visit.   Allergies: Allergies  Allergen Reactions  . Gluten Meal   . Sulfa Antibiotics    Social History: Social History   Socioeconomic History  . Marital status: Married    Spouse name: Not on file  . Number of children: Not on file  . Years of education: Not on file  . Highest education level: Not on file  Occupational History  . Not on file  Tobacco Use  . Smoking status: Never  . Smokeless tobacco: Not on file  Substance and Sexual Activity  . Alcohol use: Not on file  . Drug use: Not on file  . Sexual activity: Not on file  Other Topics Concern  . Not on file  Social History Narrative  . Not on file   Social Determinants of Health   Financial Resource Strain: Not on file  Food Insecurity: Not on file  Transportation Needs: Not on file  Physical Activity: Not on file  Stress: Not on file  Social Connections: Not on file   Lives in a ***. Smoking: *** Occupation: ***  Environmental HistoryFreight forwarder in the house: Estate agent in the family room: {Blank single:19197::"yes","no"} Carpet in the bedroom: {Blank single:19197::"yes","no"} Heating: {Blank single:19197::"electric","gas","heat pump"} Cooling: {Blank single:19197::"central","window","heat pump"} Pet: {Blank single:19197::"yes ***","no"}  Family History: Family History  Problem Relation Age of Onset  . Hypertension Other   . Hyperlipidemia Other   . Obesity Other    Problem                               Relation Asthma                                   *** Eczema                                *** Food allergy                          *** Allergic rhino conjunctivitis     ***  Review of Systems  Constitutional:  Negative for  appetite change, chills, fever and unexpected weight change.  HENT:  Negative for congestion and rhinorrhea.   Eyes:  Negative for itching.  Respiratory:  Negative for cough, chest tightness, shortness of breath and wheezing.   Cardiovascular:  Negative for chest pain.  Gastrointestinal:  Negative for abdominal pain.  Genitourinary:  Negative for difficulty urinating.  Skin:  Negative for rash.  Neurological:  Negative for headaches.   Objective: There were no vitals taken for this visit. There is no height or weight on file to calculate BMI. Physical Exam Vitals and nursing note reviewed.  Constitutional:      Appearance: Normal appearance. She is well-developed.  HENT:     Head: Normocephalic and atraumatic.     Right Ear: Tympanic membrane and external ear normal.     Left Ear: Tympanic membrane and external ear normal.     Nose: Nose normal.     Mouth/Throat:     Mouth: Mucous membranes are moist.     Pharynx: Oropharynx is clear.  Eyes:     Conjunctiva/sclera: Conjunctivae normal.  Cardiovascular:     Rate  and Rhythm: Normal rate and regular rhythm.     Heart sounds: Normal heart sounds. No murmur heard.    No friction rub. No gallop.  Pulmonary:     Effort: Pulmonary effort is normal.     Breath sounds: Normal breath sounds. No wheezing, rhonchi or rales.  Musculoskeletal:     Cervical back: Neck supple.  Skin:    General: Skin is warm.     Findings: No rash.  Neurological:     Mental Status: She is alert and oriented to person, place, and time.  Psychiatric:        Behavior: Behavior normal.  The plan was reviewed with the patient/family, and all questions/concerned were addressed.  It was my pleasure to see Erika Bell today and participate in her care. Please feel free to contact me with any questions or concerns.  Sincerely,  Rexene Alberts, DO Allergy & Immunology  [facility] office: [place] office:  [phone]

## 2022-08-31 ENCOUNTER — Ambulatory Visit (INDEPENDENT_AMBULATORY_CARE_PROVIDER_SITE_OTHER): Payer: 59 | Admitting: Allergy

## 2022-08-31 ENCOUNTER — Encounter: Payer: Self-pay | Admitting: Allergy

## 2022-08-31 ENCOUNTER — Other Ambulatory Visit (HOSPITAL_BASED_OUTPATIENT_CLINIC_OR_DEPARTMENT_OTHER): Payer: Self-pay

## 2022-08-31 ENCOUNTER — Other Ambulatory Visit: Payer: Self-pay

## 2022-08-31 VITALS — BP 118/74 | HR 66 | Temp 98.4°F | Resp 18 | Ht 69.0 in | Wt 152.8 lb

## 2022-08-31 DIAGNOSIS — J3089 Other allergic rhinitis: Secondary | ICD-10-CM | POA: Diagnosis not present

## 2022-08-31 DIAGNOSIS — L2389 Allergic contact dermatitis due to other agents: Secondary | ICD-10-CM | POA: Diagnosis not present

## 2022-08-31 DIAGNOSIS — T781XXD Other adverse food reactions, not elsewhere classified, subsequent encounter: Secondary | ICD-10-CM

## 2022-08-31 DIAGNOSIS — R21 Rash and other nonspecific skin eruption: Secondary | ICD-10-CM

## 2022-08-31 MED ORDER — EUCRISA 2 % EX OINT
1.0000 | TOPICAL_OINTMENT | Freq: Two times a day (BID) | CUTANEOUS | 2 refills | Status: AC | PRN
  Filled 2022-08-31: qty 60, 30d supply, fill #0

## 2022-08-31 NOTE — Patient Instructions (Addendum)
Discussed with patient that patch testing tests for contact dermatitis and sometimes it does not correlate to how one will react to metals in the body. Positive patch testing results can help in avoiding those items however it is possible to get false negative results.  Nevertheless, this is the most accessible test for metal sensitivity currently available.  Patches placed today - both metal and true patch test. Please avoid strenuous physical activities and do not get the patches on the back wet. No showering until final patch reading done. Okay to take antihistamines for itching but avoid placing any creams on the back where the patches are. We will remove the patches on Wednesday and will do our initial read. Then you will come back on Friday for a final read.   Rash: See below for proper skin care. Use Eucrisa (crisaborole) 2% ointment twice a day on mild rash flares on the face and body. This is a non-steroid ointment.  If it burns, place the medication in the refrigerator.  Apply a thin layer of moisturizer and then apply the Eucrisa on top of it.  Food  Continue to avoid foods that are bothersome. Consider repeat testing to foods.   Environmental allergies Use over the counter antihistamines such as Zyrtec (cetirizine), Claritin (loratadine), Allegra (fexofenadine), or Xyzal (levocetirizine) daily as needed. May take twice a day during allergy flares. May switch antihistamines every few months.  Follow up in 2 days for patch reading.   Skin care recommendations  Bath time: Always use lukewarm water. AVOID very hot or cold water. Keep bathing time to 5-10 minutes. Do NOT use bubble bath. Use a mild soap and use just enough to wash the dirty areas. Do NOT scrub skin vigorously.  After bathing, pat dry your skin with a towel. Do NOT rub or scrub the skin.  Moisturizers and prescriptions:  ALWAYS apply moisturizers immediately after bathing (within 3 minutes). This helps to  lock-in moisture. Use the moisturizer several times a day over the whole body. Good summer moisturizers include: Aveeno, CeraVe, Cetaphil. Good winter moisturizers include: Aquaphor, Vaseline, Cerave, Cetaphil, Eucerin, Vanicream. When using moisturizers along with medications, the moisturizer should be applied about one hour after applying the medication to prevent diluting effect of the medication or moisturize around where you applied the medications. When not using medications, the moisturizer can be continued twice daily as maintenance.  Laundry and clothing: Avoid laundry products with added color or perfumes. Use unscented hypo-allergenic laundry products such as Tide free, Cheer free & gentle, and All free and clear.  If the skin still seems dry or sensitive, you can try double-rinsing the clothes. Avoid tight or scratchy clothing such as wool. Do not use fabric softeners or dyer sheets.

## 2022-08-31 NOTE — Assessment & Plan Note (Signed)
Rash with nickel containing earrings.  Last reaction 1 year ago.  No prior joint replacements.  Scheduled for left partial knee replacement.  Discussed with patient that patch testing tests for contact dermatitis and sometimes it does not correlate to how one will react to metals in the body. Positive patch testing results can help in avoiding those items however it is possible to get false negative results.  Nevertheless, this is the most accessible test for metal sensitivity currently available.  Marland Kitchen Patches placed today - both metal and true patch test. . Please avoid strenuous physical activities and do not get the patches on the back wet. No showering until final patch reading done. Faythe Ghee to take antihistamines for itching but avoid placing any creams on the back where the patches are. . We will remove the patches on Wednesday and will do our initial read. . Then you will come back on Friday for a final read.  . Even if negative - consider avoiding nickel given recent reaction with Nickel containing jewelry.

## 2022-08-31 NOTE — Assessment & Plan Note (Addendum)
Patient is a vegetarian and has celiac's disease.  Avoiding dairy due to GI symptoms and lip swelling.  2017 skin testing was negative to food panel. . Continue to avoid foods that are bothersome - gluten due to celiac's disease, dairy. . Consider repeat testing to foods.

## 2022-08-31 NOTE — Assessment & Plan Note (Signed)
Facial rash started 8 weeks ago.  Tried topical hydrocortisone and mometasone cream with no benefit.  Denies changes in diet, medication or personal care products.  Does wear make-up and mask at work. . See below for proper skin care. . Use Eucrisa (crisaborole) 2% ointment twice a day on mild rash flares on the face and body. This is a non-steroid ointment.  . Distribution concerning for contact dermatitis - TRUE patches placed as above.

## 2022-08-31 NOTE — Assessment & Plan Note (Signed)
Symptomatic usually in the spring and fall.  Patient was on AIT for 3 years with some benefit.  Takes loratadine as needed. . Use over the counter antihistamines such as Zyrtec (cetirizine), Claritin (loratadine), Allegra (fexofenadine), or Xyzal (levocetirizine) daily as needed. May take twice a day during allergy flares. May switch antihistamines every few months. . Not interested in re-testing for this.

## 2022-09-01 NOTE — Progress Notes (Unsigned)
Follow Up Note  RE: Erika Bell MRN: [id number] DOB: 1976-04-04 Date of Office Visit: 09/02/2022  Referring provider: Arta Silence, MD Primary care provider: Arta Silence, MD  History of Present Illness: I had the pleasure of seeing Erika Bell for a follow up visit at the [facility] [place] [facility] [place] on 09/02/2022. She is a 46 y.o. female, who is being followed for dermatitis. Today she is here for initial patch test interpretation, given suspected history of contact dermatitis.   Diagnostics:  TRUE TEST and metal test 48 hour reading:  Positive to fragrance mix, gold and border to nickel  T.R.U.E. Test - 09/02/22 1700       Panel 1   1. Nickel Sulfate 0    2. Wool Alcohols 0    3. Neomycin Sulfate 0    4. Potassium Dichromate 0    5. Caine Mix 0    6. Fragrance Mix 1    7. Colophony 0    8. Paraben Mix 0    9. Negative Control 0    10. Balsam of [place] 0    11. Ethylenediamine Dihydrochloride 0    12. Cobalt Dichloride 0      Panel 2   13. p-tert Butylphenol Formaldehyde Resin 0    14. Epoxy Resin 0    15. Carba Mix 0    16.  Black Rubber Mix 0    17. Cl+ Me-Isothiazolinone 0    18. Quaternium-15 0    19. Methyldibromo Glutaronitrile 0    20. p-Phenylenediamine 0    21. Formaldehyde 0    22. Mercapto Mix 0    23. Thimerosal 0    24. Thiuram Mix 0      Panel 3   25. Diazolidinyl Urea 0    26. Quinoline Mix 0    27. Tixocortol-21-Pivalate 0    28. Gold Sodium Thiosulfate 1    29. Imidazolidinyl Urea 0    30. Budesonide 0    31. Hydrocortisone-17-Butyrate 0    32. Mercaptobenzothiazole 0    33. Bacitracin 0    34. Parthenolide 0    35. Disperse Blue 106 0    36. 2-Bromo-2-Nitropropane-1,3-diol 0             Metals Patch - 09/02/22 1700     Aluminum Hydroxide 10% 0    Chromium chloride 1% 0    Cobalt chloride hexahydrate 1% 0    Molybdenum chloride 0.5% 0    Nickel sulfate hexahydrate 5% --   +/-   Potassium dichromate 0.25% 0     Copper sulfate pentahydrate 2% 0    Tantal 1% 0    Titanium 0.1% 0    Manganese chloride 0.5% 0    Vanadium Pentoxide 10% 0              Assessment and Plan: Erika Bell is a 46 y.o. female with: Allergic contact dermatitis due to other agents Past history - Rash with nickel containing earrings.  Last reaction 1 year ago.  No prior joint replacements.  Scheduled for left partial knee replacement. Interim history - patches removed today. Positive to fragrance mix, gold and borderline to nickel.  The patient has been provided detailed information regarding the substances she is sensitive to, as well as products containing the substances.  Meticulous avoidance of these substances is recommended. If avoidance is not possible, the use of barrier creams or lotions is recommended. If symptoms persist or progress despite meticulous  avoidance of chemicals/substances above, dermatology evaluation may be warranted. Return in about 2 days (around 09/04/2022) for Patch reading.  It was my pleasure to see Erika Bell today and participate in her care. Please feel free to contact me with any questions or concerns.  Sincerely,  Rexene Alberts, DO Allergy & Immunology  [facility] office: [phone] [facility] office: [place] office: [phone]

## 2022-09-02 ENCOUNTER — Encounter: Payer: Self-pay | Admitting: Allergy

## 2022-09-02 ENCOUNTER — Ambulatory Visit: Payer: 59 | Admitting: Allergy

## 2022-09-02 DIAGNOSIS — Z13220 Encounter for screening for lipoid disorders: Secondary | ICD-10-CM | POA: Diagnosis not present

## 2022-09-02 DIAGNOSIS — K900 Celiac disease: Secondary | ICD-10-CM | POA: Diagnosis not present

## 2022-09-02 DIAGNOSIS — M25562 Pain in left knee: Secondary | ICD-10-CM | POA: Diagnosis not present

## 2022-09-02 DIAGNOSIS — Z01818 Encounter for other preprocedural examination: Secondary | ICD-10-CM | POA: Diagnosis not present

## 2022-09-02 DIAGNOSIS — Z5181 Encounter for therapeutic drug level monitoring: Secondary | ICD-10-CM | POA: Diagnosis not present

## 2022-09-02 DIAGNOSIS — D500 Iron deficiency anemia secondary to blood loss (chronic): Secondary | ICD-10-CM | POA: Diagnosis not present

## 2022-09-02 DIAGNOSIS — E559 Vitamin D deficiency, unspecified: Secondary | ICD-10-CM | POA: Diagnosis not present

## 2022-09-02 DIAGNOSIS — L2389 Allergic contact dermatitis due to other agents: Secondary | ICD-10-CM

## 2022-09-02 NOTE — Assessment & Plan Note (Addendum)
Past history - Rash with nickel containing earrings.  Last reaction 1 year ago.  No prior joint replacements.  Scheduled for left partial knee replacement. Interim history - patches removed today.  Positive to fragrance mix, gold and borderline to nickel.

## 2022-09-04 ENCOUNTER — Encounter: Payer: Self-pay | Admitting: Allergy

## 2022-09-04 ENCOUNTER — Ambulatory Visit: Payer: 59 | Admitting: Allergy

## 2022-09-04 DIAGNOSIS — L2389 Allergic contact dermatitis due to other agents: Secondary | ICD-10-CM

## 2022-09-04 NOTE — Progress Notes (Signed)
Follow-up Note  RE: [name] MRN: [id number] DOB: Mar 14, 1976 Date of Office Visit: 09/04/2022  Primary care provider: Arta Silence, MD Referring provider: Arta Silence, MD   Erika Bell returns to the office today for the initial patch test interpretation, given suspected history of contact dermatitis.    Diagnostics:  96 hours reading  Metals Patch     Row Name 09/04/22 1600           Reading Interval Day 5       Aluminum Hydroxide 10% 0       Chromium chloride 1% 0       Cobalt chloride hexahydrate 1% 0       Molybdenum chloride 0.5% 0       Nickel sulfate hexahydrate 5% 2       Potassium dichromate 0.25% 0       Copper sulfate pentahydrate 2% 0       Tantal 1% 0       Titanium 0.1% 0       Manganese chloride 0.5% 0       Vanadium Pentoxide 10% 0                T.R.U.E. Test     Row Name 09/04/22 1600           Reading Interval Day 5       1. Nickel Sulfate 2       2. Wool Alcohols 0       3. Neomycin Sulfate 0       4. Potassium Dichromate 0       5. Caine Mix 0       6. Fragrance Mix 1       7. Colophony 0       8. Paraben Mix 0       9. Negative Control 0       10. Balsam of [place] 0       11. Ethylenediamine Dihydrochloride 0       12. Cobalt Dichloride 0       13. p-tert Butylphenol Formaldehyde Resin 0       14. Epoxy Resin 0       15. Carba Mix 0       16.  Black Rubber Mix 0       17. Cl+ Me-Isothiazolinone 0       18. Quaternium-15 0       19. Methyldibromo Glutaronitrile 0       20. p-Phenylenediamine 0       21. Formaldehyde 0       22. Mercapto Mix 0       23. Thimerosal 0       24. Thiuram Mix 0       25. Diazolidinyl Urea 0       26. Quinoline Mix 0       27. Tixocortol-21-Pivalate 0       28. Gold Sodium Thiosulfate 1       29. Imidazolidinyl Urea 0       30. Budesonide 0       31. Hydrocortisone-17-Butyrate 0       32. Mercaptobenzothiazole 0       33. Bacitracin 0       34. Parthenolide 0       35.  Disperse Blue 106 0       36. 2-Bromo-2-Nitropropane-1,3-diol 0  Plan:  Allergic contact dermatitis - nickel, fragrance mix and gold sodium thiosulfate The patient has been provided detailed information regarding the substances she is sensitive to, as well as products containing the substances.  Meticulous avoidance of these substances is recommended. If avoidance is not possible, the use of barrier creams or lotions is recommended. Will send results to surgeon, Dr Blinda Leatherwood, MD [facility]

## 2022-10-06 DIAGNOSIS — H524 Presbyopia: Secondary | ICD-10-CM | POA: Diagnosis not present

## 2022-10-14 ENCOUNTER — Encounter: Payer: Self-pay | Admitting: Allergy

## 2022-10-14 ENCOUNTER — Ambulatory Visit (INDEPENDENT_AMBULATORY_CARE_PROVIDER_SITE_OTHER): Payer: 59 | Admitting: Allergy

## 2022-10-14 VITALS — BP 110/68 | HR 60 | Temp 98.1°F | Resp 16

## 2022-10-14 DIAGNOSIS — L2389 Allergic contact dermatitis due to other agents: Secondary | ICD-10-CM | POA: Diagnosis not present

## 2022-10-14 DIAGNOSIS — T781XXD Other adverse food reactions, not elsewhere classified, subsequent encounter: Secondary | ICD-10-CM

## 2022-10-14 DIAGNOSIS — R21 Rash and other nonspecific skin eruption: Secondary | ICD-10-CM | POA: Diagnosis not present

## 2022-10-14 NOTE — Patient Instructions (Addendum)
Food: Skin testing to select foods is positive to walnut and karaya gum.    [place] is cross-reactive with pecan thus would avoid this nut pairing The "gums" can be cross-reactive amongst themselves At this time with your specific symptoms do not feel you warrant need of epinephrine device Continue avoidance of gluten products in diet due to celiac disease  We have discussed the following in regards to foods:   Allergy: food allergy is when you have eaten a food, developed an allergic reaction after eating the food and have IgE to the food (positive food testing either by skin testing or blood testing).  Food allergy could lead to life threatening symptoms  Sensitivity: occurs when you have IgE to a food (positive food testing either by skin testing or blood testing) but is a food you eat without any issues.  This is not an allergy and we recommend keeping the food in the diet  Intolerance: this is when you have negative testing by either skin testing or blood testing thus not allergic but the food causes symptoms (like belly pain, bloating, diarrhea etc) with ingestion.  These foods should be avoided to prevent symptoms.    Rash: See below for skin care recommendations. Continue avoidance measures for nickel, fragrance mix and gold sodium thiosulfate  Eucrisa (crisaborole) 2% ointment twice a day on mild rash flares on the face and body. This is a non-steroid ointment.  If it burns, place the medication in the refrigerator.   Environmental allergies: Use over the counter antihistamines such as Zyrtec (cetirizine), Claritin (loratadine), Allegra (fexofenadine), or Xyzal (levocetirizine) daily as needed. May take twice a day during allergy flares. May switch antihistamines every few months.  Follow up in 4-6 months or sooner if needed  Skin care recommendations  Bath time: Always use lukewarm water. AVOID very hot or cold water. Keep bathing time to 5-10 minutes. Do NOT use bubble  bath. Use a mild soap and use just enough to wash the dirty areas. Do NOT scrub skin vigorously.  After bathing, pat dry your skin with a towel. Do NOT rub or scrub the skin.  Moisturizers and prescriptions:  ALWAYS apply moisturizers immediately after bathing (within 3 minutes). This helps to lock-in moisture. Use the moisturizer several times a day over the whole body. Good summer moisturizers include: Aveeno, CeraVe, Cetaphil. Good winter moisturizers include: Aquaphor, Vaseline, Cerave, Cetaphil, Eucerin, Vanicream. When using moisturizers along with medications, the moisturizer should be applied about one hour after applying the medication to prevent diluting effect of the medication or moisturize around where you applied the medications. When not using medications, the moisturizer can be continued twice daily as maintenance.  Laundry and clothing: Avoid laundry products with added color or perfumes. Use unscented hypo-allergenic laundry products such as Tide free, Cheer free & gentle, and All free and clear.  If the skin still seems dry or sensitive, you can try double-rinsing the clothes. Avoid tight or scratchy clothing such as wool. Do not use fabric softeners or dyer sheets.

## 2022-10-14 NOTE — Progress Notes (Signed)
Follow-up Note  RE: Erika Bell MRN: [id number] DOB: 01-08-1976 Date of Office Visit: 10/14/2022   History of present illness: Erika Bell is a 46 y.o. female presenting today for skin testing to foods.  She was last seen in the office on 09/04/22 for patch testing reading with initial visit on 08/31/22 with Dr Maudie Mercury.  She states she has been having a lot of GI distress and wondering if food allergy is at play.  She has history of celiac disease thus avoids gluten products and she is a vegetarian.  She did cut nuts out of the diet and states she noted improvement in the facial rash she had been having with this avoidance.  She also reports dairy can lead to rash as well.  Some foods she notes will be undigested appearing in the stool.  She has held antihistamines for skin testing today.   Review of systems: Review of Systems  Constitutional: Negative.   HENT: Negative.    Eyes: Negative.   Respiratory: Negative.    Cardiovascular: Negative.   Gastrointestinal:        See HPI  Musculoskeletal: Negative.   Skin: Negative.   Allergic/Immunologic: Negative.   Neurological: Negative.      All other systems negative unless noted above in HPI  Past medical/social/surgical/family history have been reviewed and are unchanged unless specifically indicated below.  No changes  Medication List: Current Outpatient Medications  Medication Sig Dispense Refill   Vitamin D, Ergocalciferol, (DRISDOL) 50000 units CAPS capsule TAKE 1 CAPSULE (50,000 UNITS TOTAL) BY MOUTH EVERY 7 (SEVEN) DAYS. 12 capsule 0   Crisaborole (EUCRISA) 2 % OINT Apply 1 Application topically 2 (two) times daily as needed (mild rash). (Patient not taking: Reported on 10/14/2022) 60 g 2   No current facility-administered medications for this visit.     Known medication allergies: Allergies  Allergen Reactions   Gluten Meal    Other Hives and Other (See Comments)    Positive scratch testing to walnut and     Sulfa Antibiotics    Sulfamethoxazole-Trimethoprim Other (See Comments)     Physical examination (limited): Blood pressure 110/68, pulse 60, temperature 98.1 F (36.7 C), temperature source Temporal, resp. rate 16, SpO2 99 %.  General: Alert, interactive, in no acute distress. Lungs: Clear to auscultation without wheezing, rhonchi or rales. {no increased work of breathing. CV: Normal S1, S2 without murmurs. Skin: Warm and dry, without lesions or rashes.  Diagnositics/Labs:  Allergy testing:   Food Adult Perc - 10/14/22 0900     Time Antigen Placed 2536    Allergen Manufacturer Lavella Hammock    Location Back    Number of allergen test 42     Control-buffer 50% Glycerol Negative    Control-Histamine 1 mg/ml 2+    1. Peanut Negative    2. Soybean Negative    4. Sesame Negative    5. Milk, cow Negative    6. Egg White, Chicken Negative    7. Casein Negative    10. Cashew Negative    11. Pecan Food Negative    12. [place] 2+    13. Almond Negative    14. Hazelnut Negative    15. [place] nut Negative    16. Coconut Negative    17. Pistachio Negative    31. Oat  Negative    34. Rice Negative    35. Cottonseed Negative    36. Saccharomyces Cerevisiae  Negative    42. Tomato Negative  45. Pea, Green/English Negative    46. Navy Bean Negative    47. Mushrooms Negative    48. Avocado Negative    49. Onion Negative    50. Cabbage Negative    53. Corn Negative    56. Orange  Negative    60. Strawberry Negative    61. Cantaloupe Negative    62. Watermelon Negative    63. Pineapple Negative    64. Chocolate/Cacao bean Negative    65. Karaya Gum 2+    66. Acacia (Arabic Gum) Negative    67. Cinnamon Negative    68. Nutmeg Negative    69. Ginger Negative    70. Garlic Negative    71. Pepper, black Negative    72. Mustard Negative             Allergy testing results were read and interpreted by provider, documented by clinical staff.   Assessment and plan:    Food intolerance Adverse food reaction Skin testing to select foods is positive to walnut and karaya gum.    [place] is cross-reactive with pecan thus would avoid this nut pairing The "gums" can be cross-reactive amongst themselves At this time with your specific symptoms do not feel you warrant need of epinephrine device Continue avoidance of gluten products in diet due to celiac disease  We have discussed the following in regards to foods:   Allergy: food allergy is when you have eaten a food, developed an allergic reaction after eating the food and have IgE to the food (positive food testing either by skin testing or blood testing).  Food allergy could lead to life threatening symptoms  Sensitivity: occurs when you have IgE to a food (positive food testing either by skin testing or blood testing) but is a food you eat without any issues.  This is not an allergy and we recommend keeping the food in the diet  Intolerance: this is when you have negative testing by either skin testing or blood testing thus not allergic but the food causes symptoms (like belly pain, bloating, diarrhea etc) with ingestion.  These foods should be avoided to prevent symptoms.    Dermatitis See below for skin care recommendations. Continue avoidance measures for nickel, fragrance mix and gold sodium thiosulfate  Eucrisa (crisaborole) 2% ointment twice a day on mild rash flares on the face and body. This is a non-steroid ointment.  If it burns, place the medication in the refrigerator.   Environmental allergies Use over the counter antihistamines such as Zyrtec (cetirizine), Claritin (loratadine), Allegra (fexofenadine), or Xyzal (levocetirizine) daily as needed. May take twice a day during allergy flares. May switch antihistamines every few months.  Follow up in 4-6 months or sooner if needed  I appreciate the opportunity to take part in Lotus's care. Please do not hesitate to contact me with  questions.  Sincerely,   Prudy Feeler, MD Allergy/Immunology [facility]

## 2022-12-09 DIAGNOSIS — Z419 Encounter for procedure for purposes other than remedying health state, unspecified: Secondary | ICD-10-CM | POA: Diagnosis not present

## 2022-12-22 DIAGNOSIS — Z1211 Encounter for screening for malignant neoplasm of colon: Secondary | ICD-10-CM | POA: Diagnosis not present

## 2023-01-13 ENCOUNTER — Other Ambulatory Visit (HOSPITAL_BASED_OUTPATIENT_CLINIC_OR_DEPARTMENT_OTHER): Payer: Self-pay

## 2023-01-13 DIAGNOSIS — M94262 Chondromalacia, left knee: Secondary | ICD-10-CM | POA: Diagnosis not present

## 2023-01-13 DIAGNOSIS — M1712 Unilateral primary osteoarthritis, left knee: Secondary | ICD-10-CM | POA: Diagnosis not present

## 2023-01-13 MED ORDER — DOCUSATE SODIUM 100 MG PO CAPS
ORAL_CAPSULE | ORAL | 0 refills | Status: DC
  Filled 2023-01-13: qty 100, 50d supply, fill #0

## 2023-01-13 MED ORDER — HYDROMORPHONE HCL 2 MG PO TABS
ORAL_TABLET | ORAL | 0 refills | Status: DC
  Filled 2023-01-13 – 2023-01-14 (×2): qty 40, 7d supply, fill #0

## 2023-01-13 MED ORDER — ONDANSETRON 4 MG PO TBDP
ORAL_TABLET | ORAL | 0 refills | Status: DC
  Filled 2023-01-13: qty 20, 7d supply, fill #0

## 2023-01-13 MED ORDER — OXYCODONE HCL 5 MG PO TABS
ORAL_TABLET | ORAL | 0 refills | Status: DC
  Filled 2023-01-13: qty 40, 5d supply, fill #0

## 2023-01-13 MED ORDER — METHOCARBAMOL 500 MG PO TABS
ORAL_TABLET | ORAL | 0 refills | Status: DC
  Filled 2023-01-13: qty 60, 5d supply, fill #0

## 2023-01-13 MED ORDER — ASPIRIN 81 MG PO TBEC
DELAYED_RELEASE_TABLET | ORAL | 0 refills | Status: DC
  Filled 2023-01-13: qty 60, 30d supply, fill #0

## 2023-01-14 ENCOUNTER — Other Ambulatory Visit: Payer: Self-pay

## 2023-01-14 ENCOUNTER — Other Ambulatory Visit (HOSPITAL_BASED_OUTPATIENT_CLINIC_OR_DEPARTMENT_OTHER): Payer: Self-pay

## 2023-01-19 DIAGNOSIS — Z96652 Presence of left artificial knee joint: Secondary | ICD-10-CM | POA: Diagnosis not present

## 2023-02-09 DIAGNOSIS — Z96652 Presence of left artificial knee joint: Secondary | ICD-10-CM | POA: Diagnosis not present

## 2023-02-23 DIAGNOSIS — Z96652 Presence of left artificial knee joint: Secondary | ICD-10-CM | POA: Diagnosis not present

## 2023-03-23 DIAGNOSIS — Z96652 Presence of left artificial knee joint: Secondary | ICD-10-CM | POA: Diagnosis not present

## 2023-03-25 DIAGNOSIS — N9985 Post endometrial ablation syndrome: Secondary | ICD-10-CM | POA: Diagnosis not present

## 2023-03-25 DIAGNOSIS — Z01419 Encounter for gynecological examination (general) (routine) without abnormal findings: Secondary | ICD-10-CM | POA: Diagnosis not present

## 2023-03-25 DIAGNOSIS — Z1389 Encounter for screening for other disorder: Secondary | ICD-10-CM | POA: Diagnosis not present

## 2023-03-25 DIAGNOSIS — R87810 Cervical high risk human papillomavirus (HPV) DNA test positive: Secondary | ICD-10-CM | POA: Diagnosis not present

## 2023-03-25 DIAGNOSIS — Z1231 Encounter for screening mammogram for malignant neoplasm of breast: Secondary | ICD-10-CM | POA: Diagnosis not present

## 2023-03-25 DIAGNOSIS — R87615 Unsatisfactory cytologic smear of cervix: Secondary | ICD-10-CM | POA: Diagnosis not present

## 2023-04-06 DIAGNOSIS — M24662 Ankylosis, left knee: Secondary | ICD-10-CM | POA: Diagnosis not present

## 2023-04-06 DIAGNOSIS — Z96652 Presence of left artificial knee joint: Secondary | ICD-10-CM | POA: Diagnosis not present

## 2023-04-07 DIAGNOSIS — T8482XA Fibrosis due to internal orthopedic prosthetic devices, implants and grafts, initial encounter: Secondary | ICD-10-CM | POA: Diagnosis not present

## 2023-04-07 DIAGNOSIS — M24662 Ankylosis, left knee: Secondary | ICD-10-CM | POA: Diagnosis not present

## 2023-04-07 DIAGNOSIS — G8918 Other acute postprocedural pain: Secondary | ICD-10-CM | POA: Diagnosis not present

## 2023-04-07 DIAGNOSIS — Z96652 Presence of left artificial knee joint: Secondary | ICD-10-CM | POA: Diagnosis not present

## 2023-04-08 DIAGNOSIS — M25562 Pain in left knee: Secondary | ICD-10-CM | POA: Diagnosis not present

## 2023-04-08 DIAGNOSIS — M1712 Unilateral primary osteoarthritis, left knee: Secondary | ICD-10-CM | POA: Diagnosis not present

## 2023-04-08 DIAGNOSIS — R269 Unspecified abnormalities of gait and mobility: Secondary | ICD-10-CM | POA: Diagnosis not present

## 2023-04-21 DIAGNOSIS — N879 Dysplasia of cervix uteri, unspecified: Secondary | ICD-10-CM | POA: Diagnosis not present

## 2023-04-23 DIAGNOSIS — M25562 Pain in left knee: Secondary | ICD-10-CM | POA: Diagnosis not present

## 2023-04-23 DIAGNOSIS — M1712 Unilateral primary osteoarthritis, left knee: Secondary | ICD-10-CM | POA: Diagnosis not present

## 2023-04-23 DIAGNOSIS — R269 Unspecified abnormalities of gait and mobility: Secondary | ICD-10-CM | POA: Diagnosis not present

## 2023-04-26 DIAGNOSIS — M1712 Unilateral primary osteoarthritis, left knee: Secondary | ICD-10-CM | POA: Diagnosis not present

## 2023-04-26 DIAGNOSIS — M25562 Pain in left knee: Secondary | ICD-10-CM | POA: Diagnosis not present

## 2023-04-26 DIAGNOSIS — R269 Unspecified abnormalities of gait and mobility: Secondary | ICD-10-CM | POA: Diagnosis not present

## 2023-04-27 DIAGNOSIS — M25562 Pain in left knee: Secondary | ICD-10-CM | POA: Diagnosis not present

## 2023-04-27 DIAGNOSIS — Z96652 Presence of left artificial knee joint: Secondary | ICD-10-CM | POA: Diagnosis not present

## 2023-05-10 DIAGNOSIS — R269 Unspecified abnormalities of gait and mobility: Secondary | ICD-10-CM | POA: Diagnosis not present

## 2023-05-10 DIAGNOSIS — M1712 Unilateral primary osteoarthritis, left knee: Secondary | ICD-10-CM | POA: Diagnosis not present

## 2023-05-10 DIAGNOSIS — M25562 Pain in left knee: Secondary | ICD-10-CM | POA: Diagnosis not present

## 2023-05-17 DIAGNOSIS — R269 Unspecified abnormalities of gait and mobility: Secondary | ICD-10-CM | POA: Diagnosis not present

## 2023-05-17 DIAGNOSIS — M1712 Unilateral primary osteoarthritis, left knee: Secondary | ICD-10-CM | POA: Diagnosis not present

## 2023-05-17 DIAGNOSIS — M25562 Pain in left knee: Secondary | ICD-10-CM | POA: Diagnosis not present

## 2023-05-24 DIAGNOSIS — M25562 Pain in left knee: Secondary | ICD-10-CM | POA: Diagnosis not present

## 2023-05-24 DIAGNOSIS — M1712 Unilateral primary osteoarthritis, left knee: Secondary | ICD-10-CM | POA: Diagnosis not present

## 2023-05-24 DIAGNOSIS — R269 Unspecified abnormalities of gait and mobility: Secondary | ICD-10-CM | POA: Diagnosis not present

## 2023-10-04 DIAGNOSIS — L710 Perioral dermatitis: Secondary | ICD-10-CM | POA: Diagnosis not present

## 2023-10-04 DIAGNOSIS — Z411 Encounter for cosmetic surgery: Secondary | ICD-10-CM | POA: Diagnosis not present

## 2023-10-06 DIAGNOSIS — L578 Other skin changes due to chronic exposure to nonionizing radiation: Secondary | ICD-10-CM | POA: Diagnosis not present

## 2023-10-06 DIAGNOSIS — Z86018 Personal history of other benign neoplasm: Secondary | ICD-10-CM | POA: Diagnosis not present

## 2023-10-06 DIAGNOSIS — L821 Other seborrheic keratosis: Secondary | ICD-10-CM | POA: Diagnosis not present

## 2023-10-06 DIAGNOSIS — D229 Melanocytic nevi, unspecified: Secondary | ICD-10-CM | POA: Diagnosis not present

## 2024-05-30 ENCOUNTER — Other Ambulatory Visit (HOSPITAL_BASED_OUTPATIENT_CLINIC_OR_DEPARTMENT_OTHER): Payer: Self-pay

## 2024-05-30 DIAGNOSIS — Z0000 Encounter for general adult medical examination without abnormal findings: Secondary | ICD-10-CM | POA: Diagnosis not present

## 2024-05-30 DIAGNOSIS — E559 Vitamin D deficiency, unspecified: Secondary | ICD-10-CM | POA: Diagnosis not present

## 2024-05-30 DIAGNOSIS — J301 Allergic rhinitis due to pollen: Secondary | ICD-10-CM | POA: Diagnosis not present

## 2024-05-30 DIAGNOSIS — Z1331 Encounter for screening for depression: Secondary | ICD-10-CM | POA: Diagnosis not present

## 2024-05-30 DIAGNOSIS — E538 Deficiency of other specified B group vitamins: Secondary | ICD-10-CM | POA: Diagnosis not present

## 2024-05-30 DIAGNOSIS — K900 Celiac disease: Secondary | ICD-10-CM | POA: Diagnosis not present

## 2024-05-30 DIAGNOSIS — Z1339 Encounter for screening examination for other mental health and behavioral disorders: Secondary | ICD-10-CM | POA: Diagnosis not present

## 2024-05-30 MED ORDER — ERGOCALCIFEROL 1.25 MG (50000 UT) PO CAPS
50000.0000 [IU] | ORAL_CAPSULE | ORAL | 0 refills | Status: AC
  Filled 2024-05-30: qty 3, 90d supply, fill #0

## 2024-06-05 DIAGNOSIS — D485 Neoplasm of uncertain behavior of skin: Secondary | ICD-10-CM | POA: Diagnosis not present

## 2024-06-05 DIAGNOSIS — Z411 Encounter for cosmetic surgery: Secondary | ICD-10-CM | POA: Diagnosis not present

## 2024-06-05 DIAGNOSIS — D2221 Melanocytic nevi of right ear and external auricular canal: Secondary | ICD-10-CM | POA: Diagnosis not present
